# Patient Record
Sex: Male | Born: 1990 | Hispanic: Yes | Marital: Married | Smoking: Never smoker
Health system: Southern US, Community
[De-identification: ages and names within clinical notes are randomized; demographics above are authoritative.]

## PROBLEM LIST (undated history)

## (undated) DIAGNOSIS — E739 Lactose intolerance, unspecified: Secondary | ICD-10-CM

## (undated) DIAGNOSIS — E7800 Pure hypercholesterolemia, unspecified: Secondary | ICD-10-CM

## (undated) DIAGNOSIS — I10 Essential (primary) hypertension: Secondary | ICD-10-CM

## (undated) DIAGNOSIS — R739 Hyperglycemia, unspecified: Secondary | ICD-10-CM

## (undated) DIAGNOSIS — R7303 Prediabetes: Secondary | ICD-10-CM

## (undated) HISTORY — DX: Prediabetes: R73.03

## (undated) HISTORY — DX: Lactose intolerance, unspecified: E73.9

## (undated) HISTORY — DX: Hyperglycemia, unspecified: R73.9

## (undated) HISTORY — DX: Pure hypercholesterolemia, unspecified: E78.00

## (undated) HISTORY — DX: Essential (primary) hypertension: I10

---
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
## 2017-05-08 ENCOUNTER — Other Ambulatory Visit: Payer: Self-pay

## 2017-05-08 ENCOUNTER — Encounter: Payer: Self-pay | Admitting: Emergency Medicine

## 2017-05-08 ENCOUNTER — Emergency Department
Admission: EM | Admit: 2017-05-08 | Discharge: 2017-05-08 | Disposition: A | Discharge status: Home / Self Care | Payer: Self-pay | Attending: Emergency Medicine | Admitting: Emergency Medicine

## 2017-05-08 DIAGNOSIS — J020 Streptococcal pharyngitis: Secondary | ICD-10-CM | POA: Insufficient documentation

## 2017-05-08 MED ORDER — AMOXICILLIN 875 MG PO TABS: 875.0000 mg | ORAL_TABLET | Freq: Two times a day (BID) | ORAL | 0 refills | Status: AC

## 2017-05-08 NOTE — ED Notes (Signed)
See triage note  Presents with sore throat  States he began feeling bad about 2 weeks ago  Then felt better   But on Sunday he developed fever and sore throat  Low grade fever noted on arrival

## 2017-05-08 NOTE — ED Triage Notes (Signed)
Pt with sore throat for two days

## 2017-05-08 NOTE — ED Triage Notes (Signed)
First Nurse Note:  Arrives with c/o sore throat.  AAOx3.  Skin warm and dry.. NAD.  Voice clear and strong.

## 2017-05-08 NOTE — ED Provider Notes (Signed)
[facility] Emergency Department Provider Note  ____________________________________________  Time seen: Approximately 4:14 PM  I have reviewed the triage vital signs and the nursing notes.   HISTORY  Chief Complaint Sore Throat    HPI Stanley Rice is a 27 y.o. male presents to the emergency department with pharyngitis.  Patient has experienced intermittent rhinorrhea and nonproductive cough but headache and anterior neck discomfort are primary symptoms.  Patient is tolerating fluids by mouth and his own secretions.  No known contacts with strep throat.  He denies chest tightness, chest pain, nausea and abdominal pain.   History reviewed. No pertinent past medical history.  There are no active problems to display for this patient.   History reviewed. No pertinent surgical history.  Prior to Admission medications   Medication Sig Start Date End Date Taking? Authorizing Provider  amoxicillin (AMOXIL) 875 MG tablet Take 1 tablet (875 mg total) by mouth 2 (two) times daily for 10 days. 05/08/17 05/18/17  Orvil Feil, PA-C    Allergies Patient has no known allergies.  No family history on file.  Social History Social History   Tobacco Use  . Smoking status: Never Smoker  . Smokeless tobacco: Never Used  Substance Use Topics  . Alcohol use: No    Frequency: Never  . Drug use: No     Review of Systems  Constitutional: Patient has chills.  Eyes: No visual changes. No discharge ENT: patient has pharyngitis.  Cardiovascular: no chest pain. Respiratory: Intermittent cough. No SOB. Gastrointestinal: No abdominal pain.  No nausea, no vomiting.  No diarrhea.  No constipation. Musculoskeletal: Negative for musculoskeletal pain. Skin: Negative for rash, abrasions, lacerations, ecchymosis. Neurological: Patient has headache.    ____________________________________________   PHYSICAL EXAM:  VITAL SIGNS: ED Triage Vitals  Enc Vitals Group     BP  05/08/17 1539 (!) 160/108     Pulse Rate 05/08/17 1539 97     Resp 05/08/17 1539 20     Temp 05/08/17 1539 99 F (37.2 C)     Temp Source 05/08/17 1539 Oral     SpO2 05/08/17 1539 100 %     Weight 05/08/17 1538 210 lb (95.3 kg)     Height --      Head Circumference --      Peak Flow --      Pain Score 05/08/17 1538 6     Pain Loc --      Pain Edu? --      Excl. in GC? --      Constitutional: Alert and oriented. Well appearing and in no acute distress. Eyes: Conjunctivae are normal. PERRL. EOMI. Head: Atraumatic. ENT:      Ears: TMs are pearly bilaterally.      Nose: No congestion/rhinnorhea.      Mouth/Throat: Mucous membranes are moist.  Posterior pharynx is erythematous with bilateral tonsillar hypertrophy and exudate. Hematological/Lymphatic/Immunilogical: Palpable cervical lymphadenopathy. Cardiovascular: Normal rate, regular rhythm. Normal S1 and S2.  Good peripheral circulation. Respiratory: Normal respiratory effort without tachypnea or retractions. Lungs CTAB. Good air entry to the bases with no decreased or absent breath sounds. Skin:  Skin is warm, dry and intact. No rash noted. Psychiatric: Mood and affect are normal. Speech and behavior are normal. Patient exhibits appropriate insight and judgement.   ____________________________________________   LABS (all labs ordered are listed, but only abnormal results are displayed)  Labs Reviewed - No data to display ____________________________________________  EKG   ____________________________________________  RADIOLOGY  No results found.  ____________________________________________    PROCEDURES  Procedure(s) performed:    Procedures    Medications - No data to display   ____________________________________________   INITIAL IMPRESSION / ASSESSMENT AND PLAN / ED COURSE  Pertinent labs & imaging results that were available during my care of the patient were reviewed by me and considered in  my medical decision making (see chart for details).  Review of the [place] CSRS was performed in accordance of the [facility] prior to dispensing any controlled drugs.     Assessment and plan Strep pharyngitis Patient presents to the emergency department with pharyngitis, chills, anterior neck discomfort and malaise.  Differential diagnosis included URI versus strep pharyngitis.  History and physical exam findings are most consistent with strep pharyngitis.  Patient was discharged with amoxicillin and advised to follow-up with primary care as needed.  All patient questions were answered.    ____________________________________________  FINAL CLINICAL IMPRESSION(S) / ED DIAGNOSES  Final diagnoses:  Strep pharyngitis      NEW MEDICATIONS STARTED DURING THIS VISIT:  ED Discharge Orders        Ordered    amoxicillin (AMOXIL) 875 MG tablet  2 times daily     05/08/17 1600          This chart was dictated using voice recognition software/Dragon. Despite best efforts to proofread, errors can occur which can change the meaning. Any change was purely unintentional.    Orvil FeilWoods, Yaire Kreher M, PA-C 05/08/17 1618    Dionne BucySiadecki, Sebastian, MD 05/08/17 2024

## 2017-12-21 ENCOUNTER — Other Ambulatory Visit: Payer: Self-pay

## 2017-12-21 ENCOUNTER — Emergency Department
Admission: EM | Admit: 2017-12-21 | Discharge: 2017-12-21 | Disposition: A | Discharge status: Home / Self Care | Payer: Self-pay | Attending: Emergency Medicine | Admitting: Emergency Medicine

## 2017-12-21 ENCOUNTER — Emergency Department: Payer: Self-pay

## 2017-12-21 ENCOUNTER — Encounter: Payer: Self-pay | Admitting: Emergency Medicine

## 2017-12-21 DIAGNOSIS — J020 Streptococcal pharyngitis: Secondary | ICD-10-CM | POA: Insufficient documentation

## 2017-12-21 LAB — COMPREHENSIVE METABOLIC PANEL
ALT: 28 U/L (ref 0–44)
AST: 18 U/L (ref 15–41)
Albumin: 4.4 g/dL (ref 3.5–5.0)
Alkaline Phosphatase: 59 U/L (ref 38–126)
Anion gap: 8 (ref 5–15)
BUN: 11 mg/dL (ref 6–20)
CO2: 25 mmol/L (ref 22–32)
Calcium: 8.9 mg/dL (ref 8.9–10.3)
Chloride: 102 mmol/L (ref 98–111)
Creatinine, Ser: 0.79 mg/dL (ref 0.61–1.24)
GFR calc Af Amer: >60 mL/min (ref >60)
GFR calc non Af Amer: >60 mL/min (ref >60)
Glucose, Bld: 99 mg/dL (ref 70–99)
Potassium: 3.7 mmol/L (ref 3.5–5.1)
Sodium: 135 mmol/L (ref 135–145)
Total Bilirubin: 0.4 mg/dL (ref 0.3–1.2)
Total Protein: 8 g/dL (ref 6.5–8.1)

## 2017-12-21 LAB — CBC WITH DIFFERENTIAL/PLATELET
Basophils Absolute: 0.1 10*3/uL (ref 0–0.1)
Basophils Relative: 1 %
Eosinophils Absolute: 0 10*3/uL (ref 0–0.7)
Eosinophils Relative: 0 %
HCT: 41.4 % (ref 40.0–52.0)
Hemoglobin: 14 g/dL (ref 13.0–18.0)
Lymphocytes Relative: 22 %
Lymphs Abs: 2.7 10*3/uL (ref 1.0–3.6)
MCH: 28.9 pg (ref 26.0–34.0)
MCHC: 33.8 g/dL (ref 32.0–36.0)
MCV: 85.5 fL (ref 80.0–100.0)
Monocytes Absolute: 1.5 10*3/uL — ABNORMAL HIGH (ref 0.2–1.0)
Monocytes Relative: 12 %
Neutro Abs: 7.8 10*3/uL — ABNORMAL HIGH (ref 1.4–6.5)
Neutrophils Relative %: 65 %
Platelets: 207 10*3/uL (ref 150–440)
RBC: 4.84 MIL/uL (ref 4.40–5.90)
RDW: 14.3 % (ref 11.5–14.5)
WBC: 12 10*3/uL — ABNORMAL HIGH (ref 3.8–10.6)

## 2017-12-21 MED ORDER — DEXAMETHASONE SODIUM PHOSPHATE 10 MG/ML IJ SOLN
10.0000 mg | Freq: Once | INTRAMUSCULAR | Status: DC
  Filled 2017-12-21: qty 1

## 2017-12-21 MED ORDER — SODIUM CHLORIDE 0.9 % IV SOLN
3.0000 g | Freq: Once | INTRAVENOUS | Status: AC
  Administered 2017-12-21: 3 g via INTRAVENOUS
  Filled 2017-12-21: qty 3

## 2017-12-21 MED ORDER — IOHEXOL 300 MG/ML  SOLN
75.0000 mL | Freq: Once | INTRAMUSCULAR | Status: AC | PRN
  Administered 2017-12-21: 75 mL via INTRAVENOUS
  Filled 2017-12-21: qty 75

## 2017-12-21 MED ORDER — ACETAMINOPHEN 325 MG PO TABS
650.0000 mg | ORAL_TABLET | Freq: Once | ORAL | Status: AC | PRN
  Administered 2017-12-21: 650 mg via ORAL
  Filled 2017-12-21: qty 2

## 2017-12-21 MED ORDER — AMOXICILLIN-POT CLAVULANATE 875-125 MG PO TABS: 1.0000 | ORAL_TABLET | Freq: Two times a day (BID) | ORAL | 0 refills | Status: AC

## 2017-12-21 MED ORDER — DEXAMETHASONE SODIUM PHOSPHATE 10 MG/ML IJ SOLN
10.0000 mg | Freq: Once | INTRAMUSCULAR | Status: AC
  Administered 2017-12-21: 10 mg via INTRAVENOUS

## 2017-12-21 NOTE — ED Provider Notes (Signed)
[facility] Emergency Department Provider Note  ____________________________________________  Time seen: Approximately 9:17 PM  I have reviewed the triage vital signs and the nursing notes.   HISTORY  Chief Complaint Sore Throat    HPI Stanley Rice is a 27 y.o. male presents to the emergency department with 10 out of 10 pharyngitis.  Patient reports that 2 days ago, he was seen at an urgent care facility and was diagnosed with strep pharyngitis and began taking penicillin.  Patient reports that pharyngitis seems to be worsening and his fever has not improved.  Patient reports that the right side of his throat hurts worse than the left.  Patient can swallow but is having difficulty drinking.  He denies a history of known peritonsillar abscesses.    History reviewed. No pertinent past medical history.  There are no active problems to display for this patient.   History reviewed. No pertinent surgical history.  Prior to Admission medications   Medication Sig Start Date End Date Taking? Authorizing Provider  amoxicillin-clavulanate (AUGMENTIN) 875-125 MG tablet Take 1 tablet by mouth 2 (two) times daily for 10 days. 12/21/17 12/31/17  Orvil FeilWoods, Jaclyn M, PA-C    Allergies Patient has no known allergies.  No family history on file.  Social History Social History   Tobacco Use  . Smoking status: Never Smoker  . Smokeless tobacco: Never Used  Substance Use Topics  . Alcohol use: No    Frequency: Never  . Drug use: No     Review of Systems  Constitutional: Patient has fever.  Eyes: No visual changes. No discharge ENT: Patient has pharyngitis.  Cardiovascular: no chest pain. Respiratory: no cough. No SOB. Gastrointestinal: No abdominal pain.  No nausea, no vomiting.  No diarrhea.  No constipation. Genitourinary: Negative for dysuria. No hematuria Musculoskeletal: Negative for musculoskeletal pain. Skin: Negative for rash, abrasions, lacerations,  ecchymosis. Neurological: Negative for headaches, focal weakness or numbness.   ____________________________________________   PHYSICAL EXAM:  VITAL SIGNS: ED Triage Vitals  Enc Vitals Group     BP 12/21/17 2015 139/78     Pulse Rate 12/21/17 2103 88     Resp 12/21/17 2015 20     Temp 12/21/17 2015 (!) 101.1 F (38.4 C)     Temp Source 12/21/17 2015 Oral     SpO2 12/21/17 2015 98 %     Weight 12/21/17 2016 215 lb (97.5 kg)     Height 12/21/17 2016 5\' 8"  (1.727 m)     Head Circumference --      Peak Flow --      Pain Score 12/21/17 2016 5     Pain Loc --      Pain Edu? --      Excl. in GC? --      Constitutional: Alert and oriented. Well appearing and in no acute distress. Eyes: Conjunctivae are normal. PERRL. EOMI. Head: Atraumatic. ENT:      Ears: TMs are pearly.      Nose: No congestion/rhinnorhea.      Mouth/Throat: Posterior pharynx is erythematous.  Right tonsil appears significantly larger than left. Neck: No stridor.  No cervical spine tenderness to palpation. Hematological/Lymphatic/Immunilogical: Palpable  cervical lymphadenopathy. Cardiovascular: Normal rate, regular rhythm. Normal S1 and S2.  Good peripheral circulation. Respiratory: Normal respiratory effort without tachypnea or retractions. Lungs CTAB. Good air entry to the bases with no decreased or absent breath sounds. Musculoskeletal: Full range of motion to all extremities. No gross deformities appreciated. Neurologic:  Normal  speech and language. No gross focal neurologic deficits are appreciated.  Skin:  Skin is warm, dry and intact. No rash noted. ____________________________________________   LABS (all labs ordered are listed, but only abnormal results are displayed)  Labs Reviewed  CBC WITH DIFFERENTIAL/PLATELET - Abnormal; Notable for the following components:      Result Value   WBC 12.0 (*)    Neutro Abs 7.8 (*)    Monocytes Absolute 1.5 (*)    All other components within normal limits   COMPREHENSIVE METABOLIC PANEL   ____________________________________________  EKG   ____________________________________________  RADIOLOGY I personally viewed and evaluated these images as part of my medical decision making, as well as reviewing the written report by the radiologist.    Ct Soft Tissue Neck W Contrast  Result Date: 12/21/2017 CLINICAL DATA:  Sore throat for 3 days. EXAM: CT NECK WITH CONTRAST TECHNIQUE: Multidetector CT imaging of the neck was performed using the standard protocol following the bolus administration of intravenous contrast. CONTRAST:  75mL OMNIPAQUE IOHEXOL 300 MG/ML  SOLN COMPARISON:  None. FINDINGS: PHARYNX AND LARYNX: Mildly enlarged and edematous palatine tonsils. Preservation of the parapharyngeal fat planes. Normal appearance of the epiglottis and larynx. Widely patent airway. SALIVARY GLANDS: Normal. THYROID: Normal. LYMPH NODES: Multiple enlarged cervical lymph nodes measuring to 16 mm RIGHT level 2 a with homogeneous enhancement and no pericapsular inflammation. VASCULAR: Normal.  Two vessel arch is a normal variant. LIMITED INTRACRANIAL: Normal. VISUALIZED ORBITS: Normal. MASTOIDS AND VISUALIZED PARANASAL SINUSES: Well-aerated. SKELETON: Nonacute.  Calcified stylohyoid ligaments UPPER CHEST: Lung apices are clear. No superior mediastinal lymphadenopathy. OTHER: None. IMPRESSION: 1. Mild acute tonsillitis without peritonsillar abscess. Patent airway. 2. Reactive lymphadenitis. Electronically Signed   By: Awilda Metro M.D.   On: 12/21/2017 22:21    ____________________________________________    PROCEDURES  Procedure(s) performed:    Procedures    Medications  acetaminophen (TYLENOL) tablet 650 mg (650 mg Oral Given 12/21/17 2019)  Ampicillin-Sulbactam (UNASYN) 3 g in sodium chloride 0.9 % 100 mL IVPB ( Intravenous Stopped 12/21/17 2154)  dexamethasone (DECADRON) injection 10 mg (10 mg Intravenous Given 12/21/17 2115)  iohexol  (OMNIPAQUE) 300 MG/ML solution 75 mL (75 mLs Intravenous Contrast Given 12/21/17 2205)     ____________________________________________   INITIAL IMPRESSION / ASSESSMENT AND PLAN / ED COURSE  Pertinent labs & imaging results that were available during my care of the patient were reviewed by me and considered in my medical decision making (see chart for details).  Review of the [place] CSRS was performed in accordance of the [facility] prior to dispensing any controlled drugs.  Assessment and Plan:  Strep pharyngitis Differential diagnosis included peritonsillar abscess versus tonsillitis.  On physical exam, patient's right tonsil appears significantly larger than left.  CT soft tissue neck revealed no evidence of peritonsillar abscess.  Patient was given Unasyn and Decadron in the emergency department.  Patient was advised to stop taking penicillin and was transitioned to Augmentin.  Strict return precautions were given to return to the emergency department for new or worsening symptoms.  All patient questions were answered.    ____________________________________________  FINAL CLINICAL IMPRESSION(S) / ED DIAGNOSES  Final diagnoses:  Strep throat      NEW MEDICATIONS STARTED DURING THIS VISIT:  ED Discharge Orders         Ordered    amoxicillin-clavulanate (AUGMENTIN) 875-125 MG tablet  2 times daily     12/21/17 2230  This chart was dictated using voice recognition software/Dragon. Despite best efforts to proofread, errors can occur which can change the meaning. Any change was purely unintentional.    Orvil Feil, PA-C 12/21/17 2333    Sharyn Creamer, MD 12/21/17 [phone]

## 2017-12-21 NOTE — ED Triage Notes (Signed)
Pt reports diagnosed with Strep throat on Wednesday taking antibiotics Penicillin, reports not feeling better and now has sore in lips. Pt talks in complete sentences no respiratory distress noted

## 2017-12-21 NOTE — ED Notes (Signed)
Pt reports sore throat x3 days. Pt seen by PCP on Wednesday and swabbed (negative for Strep). Pt given penicillin r/x and taken since Wednesday night without relief.

## 2017-12-21 NOTE — ED Notes (Signed)
White patch noted on right tonsil. Pt states pain started on tues and has been having fever and chills.

## 2018-10-24 ENCOUNTER — Other Ambulatory Visit: Payer: Self-pay

## 2018-10-24 DIAGNOSIS — Z20822 Contact with and (suspected) exposure to covid-19: Secondary | ICD-10-CM

## 2018-10-29 LAB — NOVEL CORONAVIRUS, NAA: SARS-CoV-2, NAA: NOT DETECTED

## 2019-03-27 IMAGING — CT CT NECK W/ CM
4 of 5 series · 15 of 33 positions shown, 17 images · IV contrast (omnipaque)
Comparison: None.

CLINICAL DATA: Sore throat for 3 days.

EXAM:
CT NECK WITH CONTRAST
TECHNIQUE: Multidetector CT imaging of the neck was performed using the
standard protocol following the bolus administration of intravenous
contrast.
CONTRAST:  75mL OMNIPAQUE IOHEXOL 300 MG/ML  SOLN

[Series 2: axial neck · axial · 0.54mm/px · z∈[+295,+401]mm · 3 of 107 slices shown]
[im 27/107  bone]
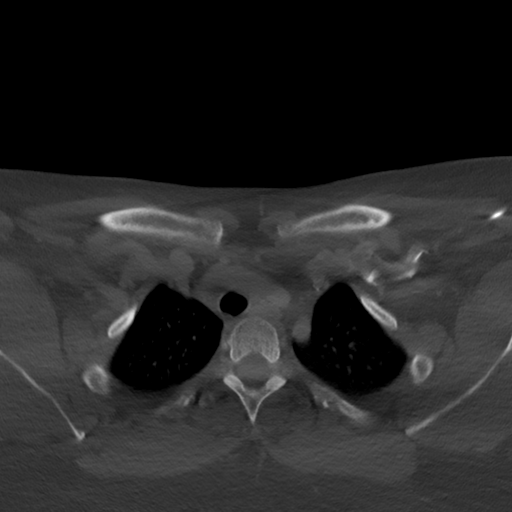
[im 54/107  bone]
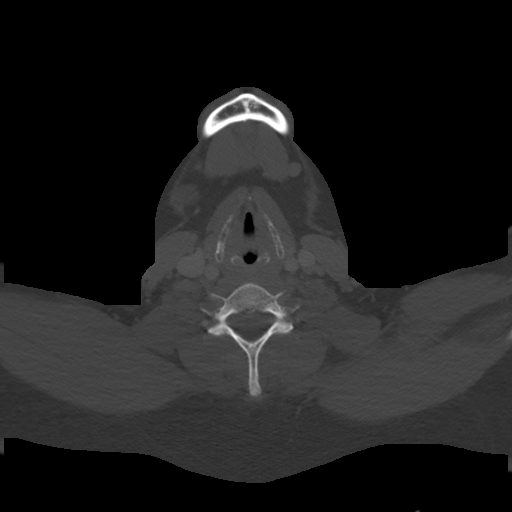
[im 80/107  bone]
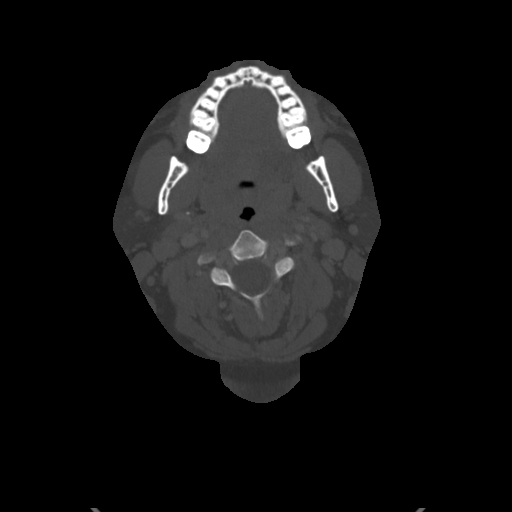

[Series 6: sag neck · sagittal · 0.45mm/px · 5 of 83 slices shown, 6 images]
[im 28/83  bone]
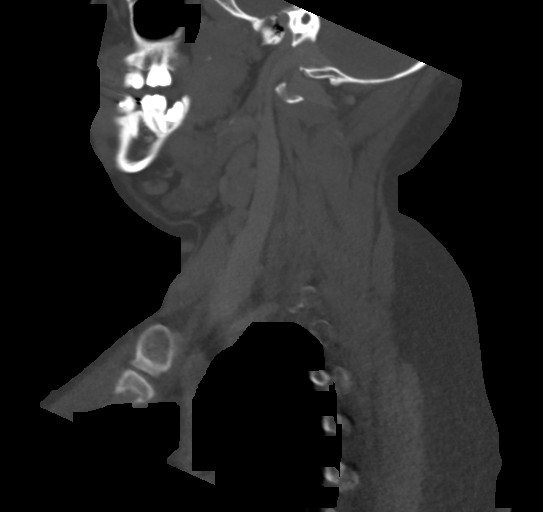
[im 35/83  bone]
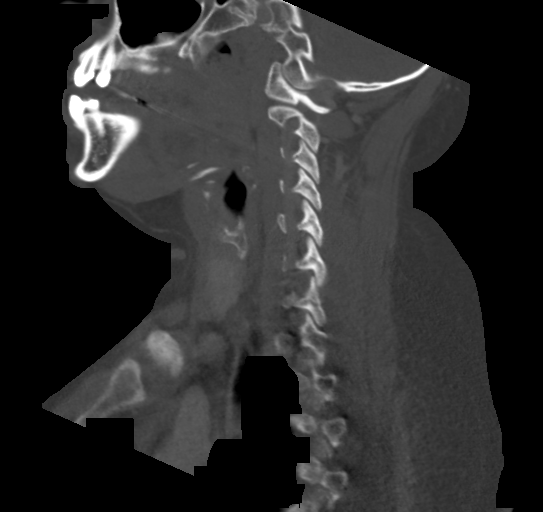
[im 42/83  soft-tissue]
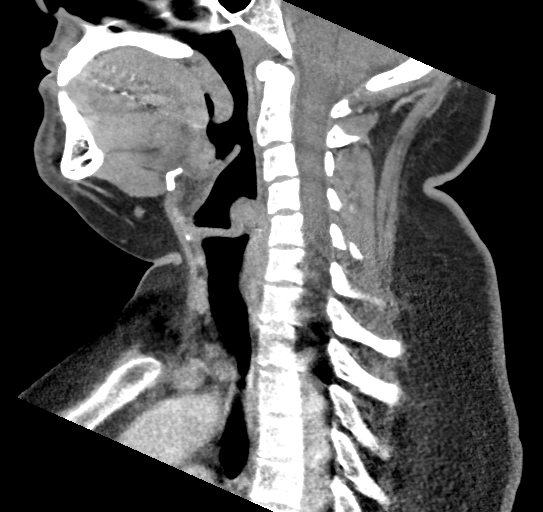
[im 42/83  bone]
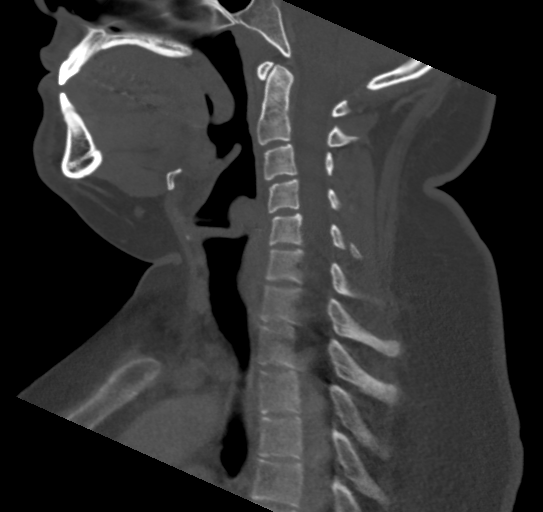
[im 48/83  bone]
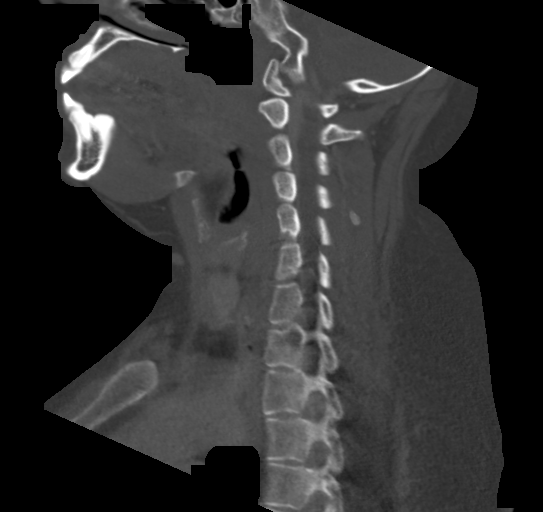
[im 55/83  bone]
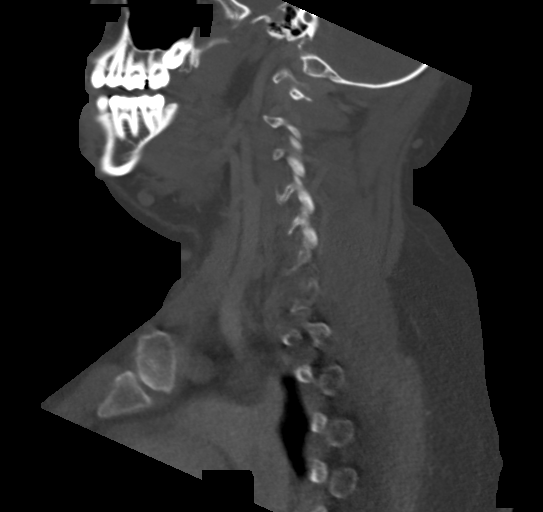

[Series 7: cor neck · coronal · 0.39mm/px · 3 of 91 slices shown]
[im 22/91  bone]
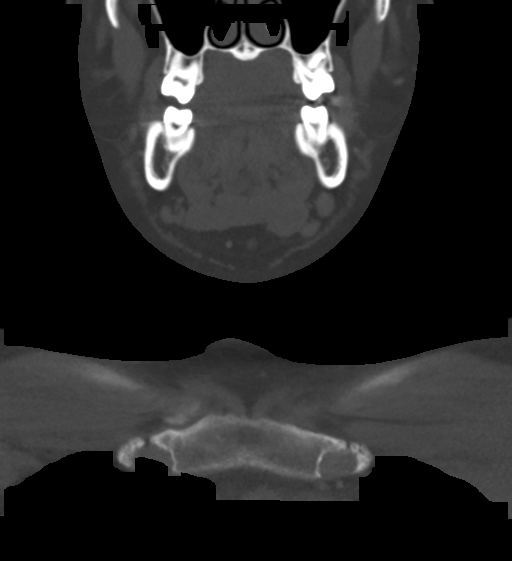
[im 38/91  bone]
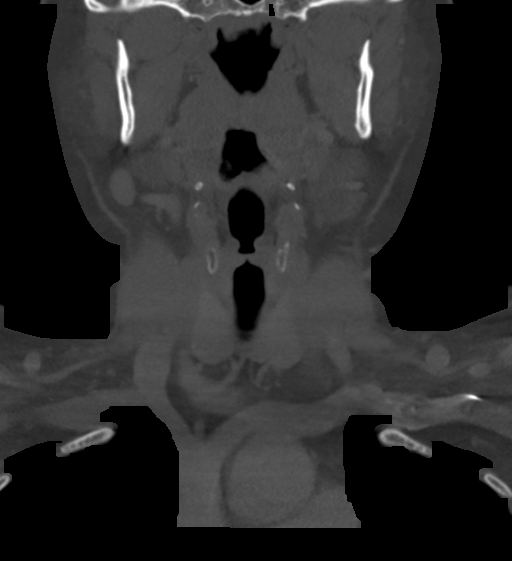
[im 53/91  bone]
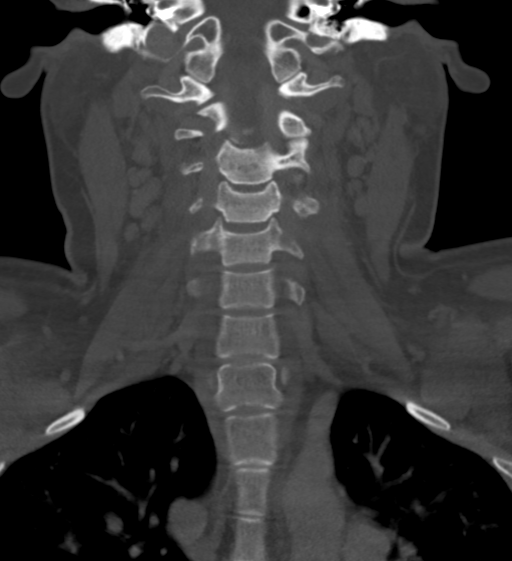

[Series 8: orthogonal ax · axial · 0.40mm/px · z∈[+246,+359]mm · 4 of 106 slices shown, 5 images]
[im 22/106  soft-tissue]
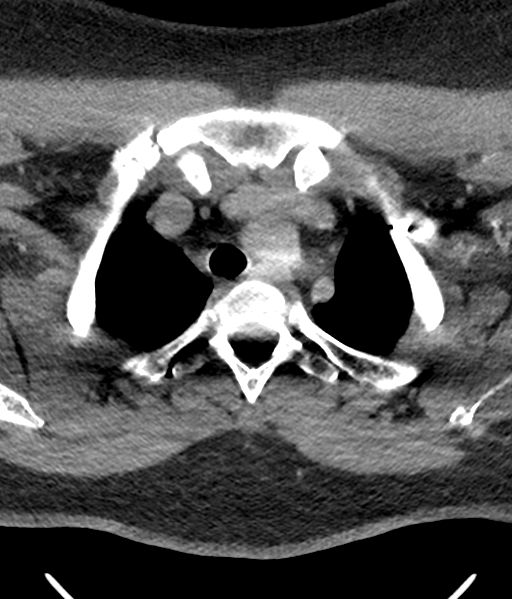
[im 22/106  bone]
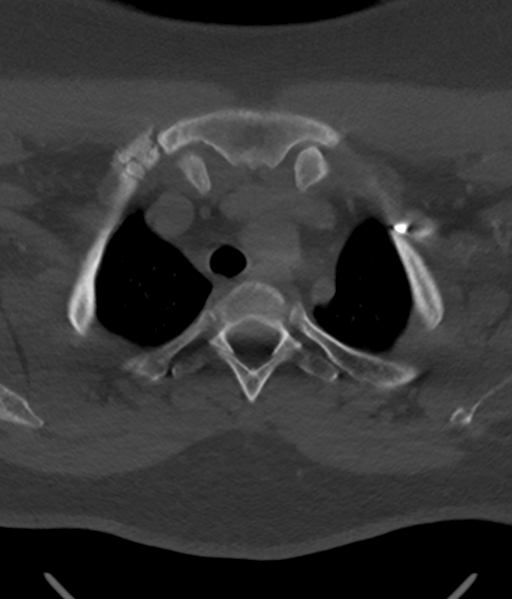
[im 43/106  bone]
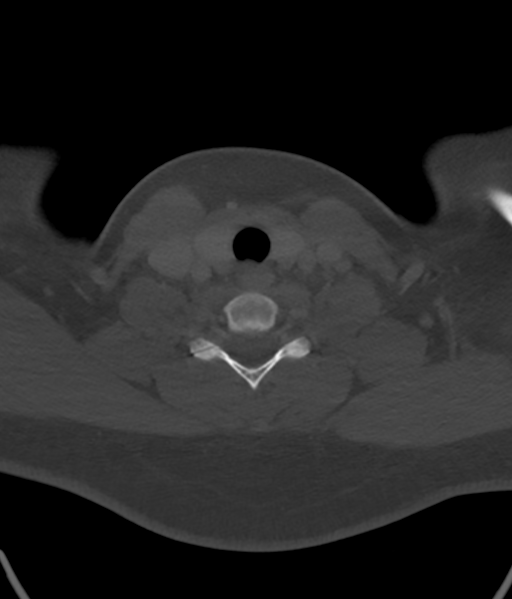
[im 64/106  bone]
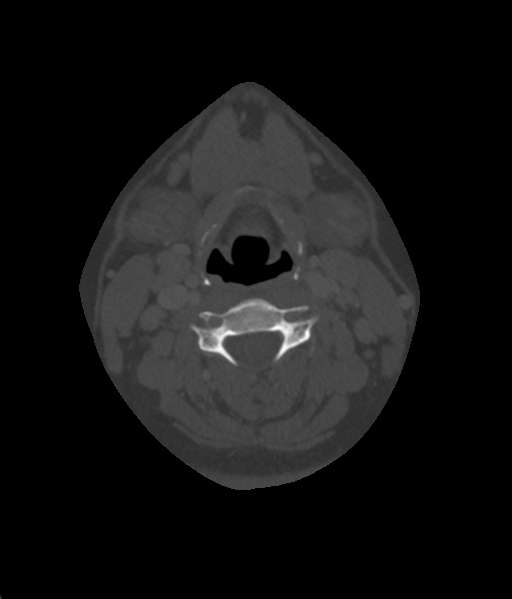
[im 85/106  bone]
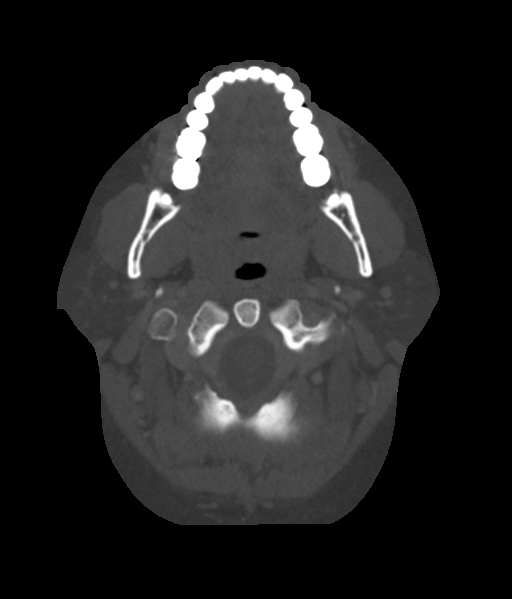

[15 of 33 positions shown; findings below may reference images not displayed]

FINDINGS: PHARYNX AND LARYNX: Mildly enlarged and edematous palatine tonsils.
Preservation of the parapharyngeal fat planes. Normal appearance of
the epiglottis and larynx. Widely patent airway.

SALIVARY GLANDS: Normal.

THYROID: Normal.

LYMPH NODES: Multiple enlarged cervical lymph nodes measuring to 16
mm RIGHT level 2 a with homogeneous enhancement and no pericapsular
inflammation.

VASCULAR: Normal.  Two vessel arch is a normal variant.

LIMITED INTRACRANIAL: Normal.

VISUALIZED ORBITS: Normal.

MASTOIDS AND VISUALIZED PARANASAL SINUSES: Well-aerated.

SKELETON: Nonacute.  Calcified stylohyoid ligaments

UPPER CHEST: Lung apices are clear. No superior mediastinal
lymphadenopathy.

OTHER: None.
IMPRESSION: 1. Mild acute tonsillitis without peritonsillar abscess. Patent
airway.
2. Reactive lymphadenitis.

## 2019-07-14 ENCOUNTER — Ambulatory Visit: Payer: Self-pay | Attending: Internal Medicine

## 2019-07-14 DIAGNOSIS — Z23 Encounter for immunization: Secondary | ICD-10-CM

## 2019-07-14 NOTE — Progress Notes (Signed)
   Covid-19 Vaccination Clinic  Name:  Stanley Rice    MRN: [id number] DOB: 02-22-1991  07/14/2019  Mr. Stanley Rice was observed post Covid-19 immunization for 15 minutes without incident. He was provided with Vaccine Information Sheet and instruction to access the V-Safe system.   Mr. Stanley Rice was instructed to call 911 with any severe reactions post vaccine: Marland Kitchen Difficulty breathing  . Swelling of face and throat  . A fast heartbeat  . A bad rash all over body  . Dizziness and weakness   Immunizations Administered    Name Date Dose VIS Date Route   Pfizer COVID-19 Vaccine 07/14/2019  8:27 AM 0.3 mL 03/21/2019 Intramuscular   Manufacturer: [employer], [employer]   Lot: [phone] [id number]   NDC: [address]-7

## 2019-08-05 ENCOUNTER — Ambulatory Visit: Payer: Self-pay

## 2019-08-12 ENCOUNTER — Ambulatory Visit: Payer: Self-pay | Attending: Internal Medicine

## 2019-08-12 DIAGNOSIS — Z23 Encounter for immunization: Secondary | ICD-10-CM

## 2019-08-12 NOTE — Progress Notes (Signed)
   Covid-19 Vaccination Clinic  Name:  Valon Glasscock    MRN: [id number] DOB: 12-07-1990  08/12/2019  Mr. Schmieder was observed post Covid-19 immunization for 15 minutes without incident. He was provided with Vaccine Information Sheet and instruction to access the V-Safe system.   Mr. Hartline was instructed to call 911 with any severe reactions post vaccine: Marland Kitchen Difficulty breathing  . Swelling of face and throat  . A fast heartbeat  . A bad rash all over body  . Dizziness and weakness   Immunizations Administered    Name Date Dose VIS Date Route   Pfizer COVID-19 Vaccine 08/12/2019  1:44 PM 0.3 mL 06/04/2018 Intramuscular   Manufacturer: [employer], [employer]   Lot: [id number]   NDC: [address]-0

## 2019-08-23 ENCOUNTER — Telehealth: Payer: Self-pay

## 2019-12-30 ENCOUNTER — Other Ambulatory Visit: Payer: Self-pay

## 2020-01-01 ENCOUNTER — Encounter: Payer: Self-pay | Admitting: Nurse Practitioner

## 2020-01-01 ENCOUNTER — Ambulatory Visit (INDEPENDENT_AMBULATORY_CARE_PROVIDER_SITE_OTHER): Payer: Managed Care, Other (non HMO) | Admitting: Nurse Practitioner

## 2020-01-01 ENCOUNTER — Other Ambulatory Visit: Payer: Self-pay

## 2020-01-01 VITALS — BP 140/92 | HR 85 | Temp 98.2°F | Ht 68.0 in | Wt 215.0 lb

## 2020-01-01 DIAGNOSIS — R7303 Prediabetes: Secondary | ICD-10-CM

## 2020-01-01 DIAGNOSIS — Z0000 Encounter for general adult medical examination without abnormal findings: Secondary | ICD-10-CM

## 2020-01-01 DIAGNOSIS — E782 Mixed hyperlipidemia: Secondary | ICD-10-CM

## 2020-01-01 DIAGNOSIS — Z6832 Body mass index (BMI) 32.0-32.9, adult: Secondary | ICD-10-CM

## 2020-01-01 DIAGNOSIS — E6609 Other obesity due to excess calories: Secondary | ICD-10-CM | POA: Insufficient documentation

## 2020-01-01 DIAGNOSIS — I10 Essential (primary) hypertension: Secondary | ICD-10-CM | POA: Diagnosis not present

## 2020-01-01 DIAGNOSIS — E66811 Obesity, class 1: Secondary | ICD-10-CM

## 2020-01-01 NOTE — Patient Instructions (Addendum)
Please continue to work on healthy diet and exercise-30 minutes 3-5 times a week.  Walking is a good source, or any kind of physical activity that you enjoy doing.  Healthy diet is one low in sugar, simple carbohydrates, fats, fast food, restaurant meals.  Focus on lean protein, nonfat or low-fat dairy products only, lots of vegetables, fruits, canola or olive oil- small amounts.   Purchase a blood pressure arm cuff-check random blood pressures write this down and call us.  Blood pressure remains above 130/80 or above, you do need to start medication while you are working on lifestyle change.    A normal blood pressure is less than 120/80.  That is where you ideally should be at age 29 years old.  Your cholesterol panel was also elevated, your BMI is 32 with goal 25, and you have prediabetes with 35-month average blood sugar 5.7.  Elevated blood pressure with new diagnosis of hypertension.  All of this is treated with diet,  Exercise,  and medication as needed.  Your BP was 150/110 repeated. I recommend starting you on a low dose BP medicine-like  losartan 25 mg daily. You want to change your lifestyle and  check your BP at home and bring in readings to discuss in a month.    I have placed referral into the medical weight loss group and they will help you get started on a plan that will work for you.  In the meantime, here is literature below to review.   Preventing Type 2 Diabetes Mellitus Type 2 diabetes (type 2 diabetes mellitus) is a long-term (chronic) disease that affects blood sugar (glucose) levels. Normally, a hormone called insulin allows glucose to enter cells in the body. The cells use glucose for energy. In type 2 diabetes, one or both of these problems may be present:  The body does not make enough insulin.  The body does not respond properly to insulin that it makes (insulin resistance). Insulin resistance or lack of insulin causes excess glucose to build up in the blood instead of  going into cells. As a result, high blood glucose (hyperglycemia) develops, which can cause many complications. Being overweight or obese and having an inactive (sedentary) lifestyle can increase your risk for diabetes. Type 2 diabetes can be delayed or prevented by making certain nutrition and lifestyle changes. What nutrition changes can be made?   Eat healthy meals and snacks regularly. Keep a healthy snack with you for when you get hungry between meals, such as fruit or a handful of nuts.  Eat lean meats and proteins that are low in saturated fats, such as chicken, fish, egg whites, and beans. Avoid processed meats.  Eat plenty of fruits and vegetables and plenty of grains that have not been processed (whole grains). It is recommended that you eat: ? 1?2 cups of fruit every day. ? 2?3 cups of vegetables every day. ? 6?8 oz of whole grains every day, such as oats, whole wheat, bulgur, brown rice, quinoa, and millet.  Eat low-fat dairy products, such as milk, yogurt, and cheese.  Eat foods that contain healthy fats, such as nuts, avocado, olive oil, and canola oil.  Drink water throughout the day. Avoid drinks that contain added sugar, such as soda or sweet tea.  Follow instructions from your health care provider about specific eating or drinking restrictions.  Control how much food you eat at a time (portion size). ? Check food labels to find out the serving sizes of foods. ?  Use a kitchen scale to weigh amounts of foods.  Saute or steam food instead of frying it. Cook with water or broth instead of oils or butter.  Limit your intake of: ? Salt (sodium). Have no more than 1 tsp (2,400 mg) of sodium a day. If you have heart disease or high blood pressure, have less than ? tsp (1,500 mg) of sodium a day. ? Saturated fat. This is fat that is solid at room temperature, such as butter or fat on meat. What lifestyle changes can be made? Activity   Do moderate-intensity physical  activity for at least 30 minutes on at least 5 days of the week, or as much as told by your health care provider.  Ask your health care provider what activities are safe for you. A mix of physical activities may be best, such as walking, swimming, cycling, and strength training.  Try to add physical activity into your day. For example: ? Park in spots that are farther away than usual, so that you walk more. For example, park in a far corner of the parking lot when you go to the office or the grocery store. ? Take a walk during your lunch break. ? Use stairs instead of elevators or escalators. Weight Loss  Lose weight as directed. Your health care provider can determine how much weight loss is best for you and can help you lose weight safely.  If you are overweight or obese, you may be instructed to lose at least 5?7 % of your body weight. Alcohol and Tobacco   Limit alcohol intake to no more than 1 drink a day for nonpregnant women and 2 drinks a day for men. One drink equals 12 oz of beer, 5 oz of wine, or 1 oz of hard liquor.  Do not use any tobacco products, such as cigarettes, chewing tobacco, and e-cigarettes. If you need help quitting, ask your health care provider. Work With Your Health Care Provider  Have your blood glucose tested regularly, as told by your health care provider.  Discuss your risk factors and how you can reduce your risk for diabetes.  Get screening tests as told by your health care provider. You may have screening tests regularly, especially if you have certain risk factors for type 2 diabetes.  Make an appointment with a diet and nutrition specialist (registered dietitian). A registered dietitian can help you make a healthy eating plan and can help you understand portion sizes and food labels. Why are these changes important?  It is possible to prevent or delay type 2 diabetes and related health problems by making lifestyle and nutrition changes.  It can be  difficult to recognize signs of type 2 diabetes. The best way to avoid possible damage to your body is to take actions to prevent the disease before you develop symptoms. What can happen if changes are not made?  Your blood glucose levels may keep increasing. Having high blood glucose for a long time is dangerous. Too much glucose in your blood can damage your blood vessels, heart, kidneys, nerves, and eyes.  You may develop prediabetes or type 2 diabetes. Type 2 diabetes can lead to many chronic health problems and complications, such as: ? Heart disease. ? Stroke. ? Blindness. ? Kidney disease. ? Depression. ? Poor circulation in the feet and legs, which could lead to surgical removal (amputation) in severe cases. Where to find support  Ask your health care provider to recommend a registered dietitian, diabetes educator, or  weight loss program.  Look for local or online weight loss groups.  Join a gym, fitness club, or outdoor activity group, such as a walking club. Where to find more information To learn more about diabetes and diabetes prevention, visit:  American Diabetes Association (ADA): www.diabetes.[employer] of Diabetes and Digestive and Kidney Diseases: ToyArticles.ca To learn more about healthy eating, visit:  The U.[place] Department of Agriculture Architect), Choose My Plate: http://yates.biz/  Office of Disease Prevention and Health Promotion (ODPHP), Dietary Guidelines: ListingMagazine.si Summary  You can reduce your risk for type 2 diabetes by increasing your physical activity, eating healthy foods, and losing weight as directed.  Talk with your health care provider about your risk for type 2 diabetes. Ask about any blood tests or screening tests that you need to have. This information is not intended to replace advice given to you by your health care provider. Make sure you discuss any questions you  have with your health care provider. Document Revised: 07/19/2018 Document Reviewed: 05/18/2015 Elsevier Patient Education  2020 [employer].  Managing Your Hypertension Hypertension is commonly called high blood pressure. This is when the force of your blood pressing against the walls of your arteries is too strong. Arteries are blood vessels that carry blood from your heart throughout your body. Hypertension forces the heart to work harder to pump blood, and may cause the arteries to become narrow or stiff. Having untreated or uncontrolled hypertension can cause heart attack, stroke, kidney disease, and other problems. What are blood pressure readings? A blood pressure reading consists of a higher number over a lower number. Ideally, your blood pressure should be below 120/80. The first ("top") number is called the systolic pressure. It is a measure of the pressure in your arteries as your heart beats. The second ("bottom") number is called the diastolic pressure. It is a measure of the pressure in your arteries as the heart relaxes. What does my blood pressure reading mean? Blood pressure is classified into four stages. Based on your blood pressure reading, your health care provider may use the following stages to determine what type of treatment you need, if any. Systolic pressure and diastolic pressure are measured in a unit called mm Hg. Normal  Systolic pressure: below 120.  Diastolic pressure: below 80. Elevated  Systolic pressure: 120-129.  Diastolic pressure: below 80. Hypertension stage 1  Systolic pressure: 130-139.  Diastolic pressure: 80-89. Hypertension stage 2  Systolic pressure: 140 or above.  Diastolic pressure: 90 or above. What health risks are associated with hypertension? Managing your hypertension is an important responsibility. Uncontrolled hypertension can lead to:  A heart attack.  A stroke.  A weakened blood vessel (aneurysm).  Heart  failure.  Kidney damage.  Eye damage.  Metabolic syndrome.  Memory and concentration problems. What changes can I make to manage my hypertension? Hypertension can be managed by making lifestyle changes and possibly by taking medicines. Your health care provider will help you make a plan to bring your blood pressure within a normal range. Eating and drinking   Eat a diet that is high in fiber and potassium, and low in salt (sodium), added sugar, and fat. An example eating plan is called the DASH (Dietary Approaches to Stop Hypertension) diet. To eat this way: ? Eat plenty of fresh fruits and vegetables. Try to fill half of your plate at each meal with fruits and vegetables. ? Eat whole grains, such as whole wheat pasta, brown rice, or whole grain bread.  Fill about one quarter of your plate with whole grains. ? Eat low-fat diary products. ? Avoid fatty cuts of meat, processed or cured meats, and poultry with skin. Fill about one quarter of your plate with lean proteins such as fish, chicken without skin, beans, eggs, and tofu. ? Avoid premade and processed foods. These tend to be higher in sodium, added sugar, and fat.  Reduce your daily sodium intake. Most people with hypertension should eat less than 1,500 mg of sodium a day.  Limit alcohol intake to no more than 1 drink a day for nonpregnant women and 2 drinks a day for men. One drink equals 12 oz of beer, 5 oz of wine, or 1 oz of hard liquor. Lifestyle  Work with your health care provider to maintain a healthy body weight, or to lose weight. Ask what an ideal weight is for you.  Get at least 30 minutes of exercise that causes your heart to beat faster (aerobic exercise) most days of the week. Activities may include walking, swimming, or biking.  Include exercise to strengthen your muscles (resistance exercise), such as weight lifting, as part of your weekly exercise routine. Try to do these types of exercises for 30 minutes at least  3 days a week.  Do not use any products that contain nicotine or tobacco, such as cigarettes and e-cigarettes. If you need help quitting, ask your health care provider.  Control any long-term (chronic) conditions you have, such as high cholesterol or diabetes. Monitoring  Monitor your blood pressure at home as told by your health care provider. Your personal target blood pressure may vary depending on your medical conditions, your age, and other factors.  Have your blood pressure checked regularly, as often as told by your health care provider. Working with your health care provider  Review all the medicines you take with your health care provider because there may be side effects or interactions.  Talk with your health care provider about your diet, exercise habits, and other lifestyle factors that may be contributing to hypertension.  Visit your health care provider regularly. Your health care provider can help you create and adjust your plan for managing hypertension. Will I need medicine to control my blood pressure? Your health care provider may prescribe medicine if lifestyle changes are not enough to get your blood pressure under control, and if:  Your systolic blood pressure is 130 or higher.  Your diastolic blood pressure is 80 or higher. Take medicines only as told by your health care provider. Follow the directions carefully. Blood pressure medicines must be taken as prescribed. The medicine does not work as well when you skip doses. Skipping doses also puts you at risk for problems. Contact a health care provider if:  You think you are having a reaction to medicines you have taken.  You have repeated (recurrent) headaches.  You feel dizzy.  You have swelling in your ankles.  You have trouble with your vision. Get help right away if:  You develop a severe headache or confusion.  You have unusual weakness or numbness, or you feel faint.  You have severe pain in your  chest or abdomen.  You vomit repeatedly.  You have trouble breathing. Summary  Hypertension is when the force of blood pumping through your arteries is too strong. If this condition is not controlled, it may put you at risk for serious complications.  Your personal target blood pressure may vary depending on your medical conditions, your age, and  other factors. For most people, a normal blood pressure is less than 120/80.  Hypertension is managed by lifestyle changes, medicines, or both. Lifestyle changes include weight loss, eating a healthy, low-sodium diet, exercising more, and limiting alcohol. This information is not intended to replace advice given to you by your health care provider. Make sure you discuss any questions you have with your health care provider. Document Revised: 07/19/2018 Document Reviewed: 02/23/2016 Elsevier Patient Education  2020 Elsevier Inc.  High Cholesterol  High cholesterol is a condition in which the blood has high levels of a white, waxy, fat-like substance (cholesterol). The human body needs small amounts of cholesterol. The liver makes all the cholesterol that the body needs. Extra (excess) cholesterol comes from the food that we eat. Cholesterol is carried from the liver by the blood through the blood vessels. If you have high cholesterol, deposits (plaques) may build up on the walls of your blood vessels (arteries). Plaques make the arteries narrower and stiffer. Cholesterol plaques increase your risk for heart attack and stroke. Work with your health care provider to keep your cholesterol levels in a healthy range. What increases the risk? This condition is more likely to develop in people who:  Eat foods that are high in animal fat (saturated fat) or cholesterol.  Are overweight.  Are not getting enough exercise.  Have a family history of high cholesterol. What are the signs or symptoms? There are no symptoms of this condition. How is this  diagnosed? This condition may be diagnosed from the results of a blood test.  If you are older than age 22, your health care provider may check your cholesterol every 4-6 years.  You may be checked more often if you already have high cholesterol or other risk factors for heart disease. The blood test for cholesterol measures:  "Bad" cholesterol (LDL cholesterol). This is the main type of cholesterol that causes heart disease. The desired level for LDL is less than 100.  "Good" cholesterol (HDL cholesterol). This type helps to protect against heart disease by cleaning the arteries and carrying the LDL away. The desired level for HDL is 60 or higher.  Triglycerides. These are fats that the body can store or burn for energy. The desired number for triglycerides is lower than 150.  Total cholesterol. This is a measure of the total amount of cholesterol in your blood, including LDL cholesterol, HDL cholesterol, and triglycerides. A healthy number is less than 200. How is this treated? This condition is treated with diet changes, lifestyle changes, and medicines. Diet changes  This may include eating more whole grains, fruits, vegetables, nuts, and fish.  This may also include cutting back on red meat and foods that have a lot of added sugar. Lifestyle changes  Changes may include getting at least 40 minutes of aerobic exercise 3 times a week. Aerobic exercises include walking, biking, and swimming. Aerobic exercise along with a healthy diet can help you maintain a healthy weight.  Changes may also include quitting smoking. Medicines  Medicines are usually given if diet and lifestyle changes have failed to reduce your cholesterol to healthy levels.  Your health care provider may prescribe a statin medicine. Statin medicines have been shown to reduce cholesterol, which can reduce the risk of heart disease. Follow these instructions at home: Eating and drinking If told by your health care  provider:  Eat chicken (without skin), fish, veal, shellfish, ground [place] breast, and round or loin cuts of red meat.  Do  not eat fried foods or fatty meats, such as hot dogs and salami.  Eat plenty of fruits, such as apples.  Eat plenty of vegetables, such as broccoli, potatoes, and carrots.  Eat beans, peas, and lentils.  Eat grains such as barley, rice, couscous, and bulgur wheat.  Eat pasta without cream sauces.  Use skim or nonfat milk, and eat low-fat or nonfat yogurt and cheeses.  Do not eat or drink whole milk, cream, ice cream, egg yolks, or hard cheeses.  Do not eat stick margarine or tub margarines that contain trans fats (also called partially hydrogenated oils).  Do not eat saturated tropical oils, such as coconut oil and palm oil.  Do not eat cakes, cookies, crackers, or other baked goods that contain trans fats.  General instructions  Exercise as directed by your health care provider. Increase your activity level with activities such as gardening, walking, and taking the stairs.  Take over-the-counter and prescription medicines only as told by your health care provider.  Do not use any products that contain nicotine or tobacco, such as cigarettes and e-cigarettes. If you need help quitting, ask your health care provider.  Keep all follow-up visits as told by your health care provider. This is important. Contact a health care provider if:  You are struggling to maintain a healthy diet or weight.  You need help to start on an exercise program.  You need help to stop smoking. Get help right away if:  You have chest pain.  You have trouble breathing. This information is not intended to replace advice given to you by your health care provider. Make sure you discuss any questions you have with your health care provider. Document Revised: 03/30/2017 Document Reviewed: 09/25/2015 Elsevier Patient Education  2020 [employer].

## 2020-01-01 NOTE — Progress Notes (Signed)
Established Patient Office Visit  Subjective:  Patient ID: Stanley Rice, male    DOB: [birth year]  Age: 29 y.o. MRN: [id number]  CC:  Chief Complaint  Patient presents with   New Patient (Initial Visit)    establish care    HPI Stanley Rice is a 29 yo male who presents to establish care with a primary care provider.  He reports that he has work physical to complete since he did not get his BMI to goal and he was diagnosed as prediabetes:  A1c 5.7, glucose 92, triglycerides 227, LDL 173, HDL 39, total cholesterol 258 creatinine 0.83 GFR 139.   He has no specific complaints.  Prediabetes: A1c 5.7.  Hyperlipidemia: He wants to work on diet and exercise before beginning medication.  BMI 32.69/obesity: His weight has gradually climbed over the last few years. Wt Readings from Last 3 Encounters:  01/01/20 215 lb (97.5 kg)  12/21/17 215 lb (97.5 kg)  05/08/17 210 lb (95.3 kg)    Hypertension: Blood pressure readings at home 142/80 -140/92.  He has been working on diet and exercise well without any trial of medication.  He denies chest pain, shortness of breath, DOE, dizziness, lightheadedness, edema.  BP Readings from Last 3 Encounters:  01/01/20 (!) 140/92  12/21/17 125/84  05/08/17 (!) 160/108    History reviewed. No pertinent surgical history.  History reviewed. No pertinent family history.   Patient presents today for complete physical.  Immunizations: Received Pfizer vaccinations 4/5 and 08/12/2019.  He is not certain about his other vaccines such as Tdap Diet: Not following a healthy diet yet. Exercise: No regular exercise Dentist: UTD Vision: UTD  Social History   Socioeconomic History   Marital status: Married    Spouse name: Not on file   Number of children: Not on file   Years of education: Not on file   Highest education level: Not on file  Occupational History   Occupation: lab assist    Employer: [employer]  Tobacco Use   Smoking status: Never Smoker    Smokeless tobacco: Never Used  Vaping Use   Vaping Use: Never used  Substance and Sexual Activity   Alcohol use: No   Drug use: No   Sexual activity: Yes  Other Topics Concern   Not on file  Social History Narrative   Married with 2 kids, age 21 and 54 yo      Going to school at [employer] for Research scientist (medical). Work as Teacher, English as a foreign language.    Social Determinants of Corporate investment banker Strain:    Difficulty of Paying Living Expenses: Not on file  Food Insecurity:    Worried About Programme researcher, broadcasting/film/video in the Last Year: Not on file   [employer] of Food in the Last Year: Not on file  Transportation Needs:    Lack of Transportation (Medical): Not on file   Lack of Transportation (Non-Medical): Not on file  Physical Activity:    Days of Exercise per Week: Not on file   Minutes of Exercise per Session: Not on file  Stress:    Feeling of Stress : Not on file  Social Connections:    Frequency of Communication with Friends and Family: Not on file   Frequency of Social Gatherings with Friends and Family: Not on file   Attends Religious Services: Not on file   Active Member of Clubs or Organizations: Not on file   Attends Banker Meetings: Not on file  Marital Status: Not on file  Intimate Partner Violence:    Fear of Current or Ex-Partner: Not on file   Emotionally Abused: Not on file   Physically Abused: Not on file   Sexually Abused: Not on file    Outpatient Medications Prior to Visit  Medication Sig Dispense Refill   fexofenadine (ALLEGRA) 180 MG tablet Take 180 mg by mouth daily.     No facility-administered medications prior to visit.    No Known Allergies  Review of Systems  Constitutional: Negative.   HENT: Negative.   Eyes: Negative.   Respiratory: Negative.   Cardiovascular: Negative.   Gastrointestinal: Negative.   Endocrine: Negative.   Genitourinary: Negative.   Musculoskeletal: Negative.   Skin: Negative.        Skin blemishes-  saw DERM before- fungus- treatment. Wants a referral.   Allergic/Immunologic: Negative.   Neurological: Negative for dizziness and headaches.  Hematological: Negative for adenopathy. Does not bruise/bleed easily.  Psychiatric/Behavioral: Negative.       Objective:    Physical Exam Vitals reviewed.  Constitutional:      Appearance: He is obese.  HENT:     Head: Normocephalic and atraumatic.  Eyes:     Conjunctiva/sclera: Conjunctivae normal.     Pupils: Pupils are equal, round, and reactive to light.  Cardiovascular:     Rate and Rhythm: Normal rate and regular rhythm.     Pulses: Normal pulses.     Heart sounds: Normal heart sounds.  Pulmonary:     Effort: Pulmonary effort is normal.     Breath sounds: Normal breath sounds.  Abdominal:     Palpations: Abdomen is soft.     Tenderness: There is no abdominal tenderness.  Musculoskeletal:        General: Normal range of motion.     Cervical back: Normal range of motion.  Skin:    General: Skin is warm and dry.  Neurological:     General: No focal deficit present.     Mental Status: He is alert and oriented to person, place, and time.  Psychiatric:        Mood and Affect: Mood normal.        Behavior: Behavior normal.        Thought Content: Thought content normal.        Judgment: Judgment normal.     BP (!) 140/92 (BP Location: Left Arm, Patient Position: Sitting, Cuff Size: Normal)    Pulse 85    Temp 98.2 F (36.8 C) (Oral)    Ht 5\' 8"  (1.727 m)    Wt 215 lb (97.5 kg)    SpO2 99%    BMI 32.69 kg/m  Wt Readings from Last 3 Encounters:  01/01/20 215 lb (97.5 kg)  12/21/17 215 lb (97.5 kg)  05/08/17 210 lb (95.3 kg)   Pulse Readings from Last 3 Encounters:  01/01/20 85  12/21/17 75  05/08/17 97    BP Readings from Last 3 Encounters:  01/01/20 (!) 140/92  12/21/17 125/84  05/08/17 (!) 160/108    No results found for: CHOL, HDL, LDLCALC, LDLDIRECT, TRIG, CHOLHDL    Health Maintenance Due  Topic Date Due     Hepatitis C Screening  Never done   HIV Screening  Never done   TETANUS/TDAP  Never done   INFLUENZA VACCINE  Never done    There are no preventive care reminders to display for this patient.  No results found for: TSH Lab Results  Component  Value Date   WBC 12.0 (H) 12/21/2017   HGB 14.0 12/21/2017   HCT 41.4 12/21/2017   MCV 85.5 12/21/2017   PLT 207 12/21/2017   Lab Results  Component Value Date   NA 135 12/21/2017   K 3.7 12/21/2017   CO2 25 12/21/2017   GLUCOSE 99 12/21/2017   BUN 11 12/21/2017   CREATININE 0.79 12/21/2017   BILITOT 0.4 12/21/2017   ALKPHOS 59 12/21/2017   AST 18 12/21/2017   ALT 28 12/21/2017   PROT 8.0 12/21/2017   ALBUMIN 4.4 12/21/2017   CALCIUM 8.9 12/21/2017   ANIONGAP 8 12/21/2017   No results found for: CHOL No results found for: HDL No results found for: LDLCALC No results found for: TRIG No results found for: CHOLHDL No results found for: QJJH4R    Assessment & Plan:   Problem List Items Addressed This Visit      Cardiovascular and Mediastinum   Essential hypertension     Other   Encounter for medical examination to establish care - Primary   Class 1 obesity due to excess calories with serious comorbidity and body mass index (BMI) of 32.0 to 32.9 in adult   Relevant Orders   Amb Ref to Medical Weight Management   Mixed hyperlipidemia   Pre-diabetes      No orders of the defined types were placed in this encounter. Pt advised:  Please continue to work on healthy diet and exercise-30 minutes 3-5 times a week.  Walking is a good source, or any kind of physical activity that you enjoy doing.  Healthy diet is one low in sugar, simple carbohydrates, fats, fast food, restaurant meals.  Focus on lean protein, nonfat or low-fat dairy products only, lots of vegetables, fruits, canola or olive oil- small amounts.   Purchase a blood pressure arm cuff-check random blood pressures write this down and call us.  Blood pressure  remains above 130/80 or above, you do need to start medication while you are working on lifestyle change.    A normal blood pressure is less than 120/80.  That is where you ideally should be at age 34 years old.  Your cholesterol panel was also elevated, your BMI is 32 with goal 25, and you have prediabetes with 52-month average blood sugar 5.7.  Elevated blood pressure with new diagnosis of hypertension.  All of this is treated with diet,  Exercise,  and medication as needed.  Your BP was 150/110 repeated. I recommend starting you on a low dose BP medicine-like  losartan 25 mg daily. You want to change your lifestyle and  check your BP at home and bring in readings to discuss in a month.    I have placed referral into the medical weight loss group and they will help you get started on a plan that will work for you.  A total of 45 minutes was spent with patient more than half of which was spent in counseling patient on the above mentioned issues , reviewing and explaining recent labs and imaging studies done, and coordination of care.  Follow-up: Return in about 3 months (around 04/01/2020).   This visit occurred during the SARS-CoV-2 public health emergency.  Safety protocols were in place, including screening questions prior to the visit, additional usage of staff PPE, and extensive cleaning of exam room while observing appropriate contact time as indicated for disinfecting solutions.   Amedeo Kinsman, NP

## 2020-01-26 ENCOUNTER — Other Ambulatory Visit: Payer: Self-pay

## 2020-01-26 ENCOUNTER — Encounter (INDEPENDENT_AMBULATORY_CARE_PROVIDER_SITE_OTHER): Payer: Self-pay | Admitting: Family Medicine

## 2020-01-26 ENCOUNTER — Ambulatory Visit (INDEPENDENT_AMBULATORY_CARE_PROVIDER_SITE_OTHER): Payer: Managed Care, Other (non HMO) | Admitting: Family Medicine

## 2020-01-26 VITALS — BP 155/87 | HR 76 | Temp 98.4°F | Ht 67.0 in | Wt 199.0 lb

## 2020-01-26 DIAGNOSIS — R0602 Shortness of breath: Secondary | ICD-10-CM

## 2020-01-26 DIAGNOSIS — I10 Essential (primary) hypertension: Secondary | ICD-10-CM | POA: Diagnosis not present

## 2020-01-26 DIAGNOSIS — Z0289 Encounter for other administrative examinations: Secondary | ICD-10-CM

## 2020-01-26 DIAGNOSIS — R7303 Prediabetes: Secondary | ICD-10-CM

## 2020-01-26 DIAGNOSIS — E669 Obesity, unspecified: Secondary | ICD-10-CM

## 2020-01-26 DIAGNOSIS — Z1331 Encounter for screening for depression: Secondary | ICD-10-CM

## 2020-01-26 DIAGNOSIS — Z6831 Body mass index (BMI) 31.0-31.9, adult: Secondary | ICD-10-CM

## 2020-01-26 DIAGNOSIS — R5383 Other fatigue: Secondary | ICD-10-CM | POA: Diagnosis not present

## 2020-01-26 DIAGNOSIS — Z9189 Other specified personal risk factors, not elsewhere classified: Secondary | ICD-10-CM | POA: Diagnosis not present

## 2020-01-27 LAB — T4: T4, Total: 7 ug/dL (ref 4.5–12.0)

## 2020-01-27 LAB — CBC WITH DIFFERENTIAL/PLATELET
Basophils Absolute: 0 10*3/uL (ref 0.0–0.2)
Basos: 1 %
EOS (ABSOLUTE): 0 10*3/uL (ref 0.0–0.4)
Eos: 1 %
Hematocrit: 44 % (ref 37.5–51.0)
Hemoglobin: 14.2 g/dL (ref 13.0–17.7)
Immature Grans (Abs): 0 10*3/uL (ref 0.0–0.1)
Immature Granulocytes: 0 %
Lymphocytes Absolute: 1.9 10*3/uL (ref 0.7–3.1)
Lymphs: 29 %
MCH: 28.5 pg (ref 26.6–33.0)
MCHC: 32.3 g/dL (ref 31.5–35.7)
MCV: 88 fL (ref 79–97)
Monocytes Absolute: 0.5 10*3/uL (ref 0.1–0.9)
Monocytes: 7 %
Neutrophils Absolute: 4.3 10*3/uL (ref 1.4–7.0)
Neutrophils: 62 %
Platelets: 262 10*3/uL (ref 150–450)
RBC: 4.99 x10E6/uL (ref 4.14–5.80)
RDW: 13.3 % (ref 11.6–15.4)
WBC: 6.7 10*3/uL (ref 3.4–10.8)

## 2020-01-27 LAB — COMPREHENSIVE METABOLIC PANEL
ALT: 37 IU/L (ref 0–44)
AST: 27 IU/L (ref 0–40)
Albumin/Globulin Ratio: 1.7 (ref 1.2–2.2)
Albumin: 5 g/dL (ref 4.1–5.2)
Alkaline Phosphatase: 83 IU/L (ref 44–121)
BUN/Creatinine Ratio: 9 (ref 9–20)
BUN: 8 mg/dL (ref 6–20)
Bilirubin Total: 0.4 mg/dL (ref 0.0–1.2)
CO2: 27 mmol/L (ref 20–29)
Calcium: 9.7 mg/dL (ref 8.7–10.2)
Chloride: 100 mmol/L (ref 96–106)
Creatinine, Ser: 0.87 mg/dL (ref 0.76–1.27)
GFR calc Af Amer: 137 mL/min/{1.73_m2} (ref >59)
GFR calc non Af Amer: 118 mL/min/{1.73_m2} (ref >59)
Globulin, Total: 2.9 g/dL (ref 1.5–4.5)
Glucose: 81 mg/dL (ref 65–99)
Potassium: 4.8 mmol/L (ref 3.5–5.2)
Sodium: 140 mmol/L (ref 134–144)
Total Protein: 7.9 g/dL (ref 6.0–8.5)

## 2020-01-27 LAB — INSULIN, RANDOM: INSULIN: 9.6 u[IU]/mL (ref 2.6–24.9)

## 2020-01-27 LAB — VITAMIN B12: Vitamin B-12: 1181 pg/mL (ref 232–1245)

## 2020-01-27 LAB — FOLATE: Folate: 15.4 ng/mL (ref >3.0)

## 2020-01-27 LAB — T3: T3, Total: 94 ng/dL (ref 71–180)

## 2020-01-27 LAB — TSH: TSH: 1.31 u[IU]/mL (ref 0.450–4.500)

## 2020-01-27 LAB — VITAMIN D 25 HYDROXY (VIT D DEFICIENCY, FRACTURES): Vit D, 25-Hydroxy: 27.2 ng/mL — ABNORMAL LOW (ref 30.0–100.0)

## 2020-01-31 ENCOUNTER — Encounter (INDEPENDENT_AMBULATORY_CARE_PROVIDER_SITE_OTHER): Payer: Self-pay | Admitting: Family Medicine

## 2020-02-02 ENCOUNTER — Encounter: Payer: Self-pay | Admitting: Nurse Practitioner

## 2020-02-02 ENCOUNTER — Telehealth (INDEPENDENT_AMBULATORY_CARE_PROVIDER_SITE_OTHER): Payer: Managed Care, Other (non HMO) | Admitting: Nurse Practitioner

## 2020-02-02 ENCOUNTER — Other Ambulatory Visit: Payer: Self-pay

## 2020-02-02 VITALS — Ht 67.0 in | Wt 204.0 lb

## 2020-02-02 DIAGNOSIS — Z6832 Body mass index (BMI) 32.0-32.9, adult: Secondary | ICD-10-CM | POA: Diagnosis not present

## 2020-02-02 DIAGNOSIS — I10 Essential (primary) hypertension: Secondary | ICD-10-CM

## 2020-02-02 DIAGNOSIS — E6609 Other obesity due to excess calories: Secondary | ICD-10-CM | POA: Diagnosis not present

## 2020-02-02 NOTE — Progress Notes (Signed)
Virtual Visit via telephone  Note  This visit type was conducted due to national recommendations for restrictions regarding the COVID-19 pandemic (e.g. social distancing).  This format is felt to be most appropriate for this patient at this time.  All issues noted in this document were discussed and addressed.  No physical exam was performed (except for noted visual exam findings with Video Visits).   I connected with@ on 02/02/20 at 11:00 AM EDT by a video enabled telemedicine application or telephone and verified that I am speaking with the correct person using two identifiers. Location patient: car Location provider: work or home office Persons participating in the virtual visit: patient, provider  I discussed the limitations, risks, security and privacy concerns of performing an evaluation and management service by telephone and the availability of in person appointments. I also discussed with the patient that there may be a patient responsible charge related to this service. The patient expressed understanding and agreed to proceed.  Interactive audio and video telecommunications were attempted between this provider and patient, however failed, due to patient having technical difficulties.  We continued and completed visit with audio only.  Reason for visit: He established care last month and presents for a HTN  follow up  HPI: HTN: To review BP at homes and decide on starting meds.He gets nervous in doctors office and at home they run much lower.  He has been working on lifestyle changes.  He did purchase a blood pressure cuff and is getting blood pressures at home 134/93-125/86.  He has opted to not start medication and work on lifestyle for the next 3 months.  BP Readings from Last 3 Encounters:  01/26/20 (!) 155/87  01/01/20 (!) 140/92  12/21/17 125/84    Prediabetes: A1c 5.7.  Hyperlipidemia: He wants to work on diet and exercise before beginning medication. [facility].  He went to the weight loss appt and started on their diet plan. He weighed 199 lbs and has gained weight on the plan.He thinks he is eating more carbs than he usually does. Going to the gym 4-5 days a week runs 30 min and light weights and running.    Wt Readings from Last 3 Encounters:  02/02/20 204 lb (92.5 kg)  01/26/20 199 lb (90.3 kg)  01/01/20 215 lb (97.5 kg)   Vit D3: Vit D level 27 and advised  800 IU  daily  Immunizations:Vaccines had Hep B series with his job in 2019, tetanus  2012 - due next year.  Pfizer booster- due in Nov.    ROS: Review of Systems  Constitutional: Negative for fever and weight loss.  HENT: Negative.   Respiratory: Negative.   Cardiovascular: Negative.   Gastrointestinal: Negative.   Genitourinary: Negative.   Musculoskeletal: Negative.   Skin: Negative for rash.  Psychiatric/Behavioral: Negative.     Past Medical History:  Diagnosis Date  . High blood sugar   . High cholesterol   . Hypertension   . Lactose intolerance   . Prediabetes     No past surgical history on file.  Family History  Problem Relation Age of Onset  . Miscarriages / [place] Mother   . Diabetes Mother   . Alcohol abuse Father   . Hypertension Father   . Anxiety disorder Father   . Alcoholism Father   . Obesity Father     SOCIAL HX: Negative alcohol, tobacco   Current Outpatient Medications:  .  fexofenadine (ALLEGRA) 180 MG tablet, Take 180 mg  by mouth daily., Disp: , Rfl:   EXAM: Patient was seen on the screen while we were working on getting the sound.   VITALS per patient if applicable:Wt 204 lbs, 01/23/20:  128/86  GENERAL: alert, oriented, appears well and in no acute distress  HEENT: atraumatic, conjunctiva clear, no obvious abnormalities on inspection of external nose and ears  NECK: normal movements of the head and neck  LUNGS: on inspection no signs of respiratory distress, breathing rate appears normal, no obvious gross SOB, gasping or  wheezing  CV: no obvious cyanosis  MS: moves all visible extremities without noticeable abnormality  PSYCH/NEURO: pleasant and cooperative, no obvious depression or anxiety, speech and thought processing grossly intact  ASSESSMENT AND PLAN:  Discussed the following assessment and plan:  Essential hypertension  Class 1 obesity due to excess calories with serious comorbidity and body mass index (BMI) of 32.0 to 32.9 in adult    Pt advised:  Continue with the [facility] and discuss recent weight gain with their team as you think you may be getting more calories than you are burning.  Continue with the gym activity.  You are doing a very good job.  We reviewed the labs that [facility] performed.  You may take additional vitamin D 3 supplement 800 to 1000 IU daily.  You have prediabetes and are working on weight loss and exercise which will help.   You purchased  a blood pressure cuff and please check the blood pressure randomly about 3 times a week or so keep a record.  We can see you in follow-up in 3 months see how your BP is running.  We discussed adding a low-dose blood pressure medicine if needed.    I discussed the assessment and treatment plan with the patient. The patient was provided an opportunity to ask questions and all were answered. The patient agreed with the plan and demonstrated an understanding of the instructions.   The patient was advised to call back or seek an in-person evaluation if the symptoms worsen or if the condition fails to improve as anticipated.  Follow up in 3 months .   I provided 12 minutes of time during this encounter.  Amedeo Kinsman, NP Adult Nurse Practitioner [facility] [employer] [phone]

## 2020-02-02 NOTE — Patient Instructions (Addendum)
Continue with the [facility] and discuss recent weight gain with their team as you think you may be getting more calories than you are burning.  Continue with the gym activity.  You are doing a very good job.  We reviewed the labs that [facility] performed.  You may take additional vitamin D 3 supplement 800 to 1000 IU daily.  You have prediabetes and are working on weight loss and exercise which will help.   You purchased  a blood pressure cuff and please check the blood pressure randomly about 3 times a week or so keep a record.  We can see you in follow-up in 3 months see how your BP is running.  We discussed adding a low-dose blood pressure medicine if needed.    Managing Your Hypertension Hypertension is commonly called high blood pressure. This is when the force of your blood pressing against the walls of your arteries is too strong. Arteries are blood vessels that carry blood from your heart throughout your body. Hypertension forces the heart to work harder to pump blood, and may cause the arteries to become narrow or stiff. Having untreated or uncontrolled hypertension can cause heart attack, stroke, kidney disease, and other problems. What are blood pressure readings? A blood pressure reading consists of a higher number over a lower number. Ideally, your blood pressure should be below 120/80. The first ("top") number is called the systolic pressure. It is a measure of the pressure in your arteries as your heart beats. The second ("bottom") number is called the diastolic pressure. It is a measure of the pressure in your arteries as the heart relaxes. What does my blood pressure reading mean? Blood pressure is classified into four stages. Based on your blood pressure reading, your health care provider may use the following stages to determine what type of treatment you need, if any. Systolic pressure and diastolic pressure are measured in a unit called mm Hg. Normal  Systolic pressure:  below 120.  Diastolic pressure: below 80. Elevated  Systolic pressure: 120-129.  Diastolic pressure: below 80. Hypertension stage 1  Systolic pressure: 130-139.  Diastolic pressure: 80-89. Hypertension stage 2  Systolic pressure: 140 or above.  Diastolic pressure: 90 or above. What health risks are associated with hypertension? Managing your hypertension is an important responsibility. Uncontrolled hypertension can lead to:  A heart attack.  A stroke.  A weakened blood vessel (aneurysm).  Heart failure.  Kidney damage.  Eye damage.  Metabolic syndrome.  Memory and concentration problems. What changes can I make to manage my hypertension? Hypertension can be managed by making lifestyle changes and possibly by taking medicines. Your health care provider will help you make a plan to bring your blood pressure within a normal range. Eating and drinking   Eat a diet that is high in fiber and potassium, and low in salt (sodium), added sugar, and fat. An example eating plan is called the DASH (Dietary Approaches to Stop Hypertension) diet. To eat this way: ? Eat plenty of fresh fruits and vegetables. Try to fill half of your plate at each meal with fruits and vegetables. ? Eat whole grains, such as whole wheat pasta, brown rice, or whole grain bread. Fill about one quarter of your plate with whole grains. ? Eat low-fat diary products. ? Avoid fatty cuts of meat, processed or cured meats, and poultry with skin. Fill about one quarter of your plate with lean proteins such as fish, chicken without skin, beans, eggs, and  tofu. ? Avoid premade and processed foods. These tend to be higher in sodium, added sugar, and fat.  Reduce your daily sodium intake. Most people with hypertension should eat less than 1,500 mg of sodium a day.  Limit alcohol intake to no more than 1 drink a day for nonpregnant women and 2 drinks a day for men. One drink equals 12 oz of beer, 5 oz of wine, or 1  oz of hard liquor. Lifestyle  Work with your health care provider to maintain a healthy body weight, or to lose weight. Ask what an ideal weight is for you.  Get at least 30 minutes of exercise that causes your heart to beat faster (aerobic exercise) most days of the week. Activities may include walking, swimming, or biking.  Include exercise to strengthen your muscles (resistance exercise), such as weight lifting, as part of your weekly exercise routine. Try to do these types of exercises for 30 minutes at least 3 days a week.  Do not use any products that contain nicotine or tobacco, such as cigarettes and e-cigarettes. If you need help quitting, ask your health care provider.  Control any long-term (chronic) conditions you have, such as high cholesterol or diabetes. Monitoring  Monitor your blood pressure at home as told by your health care provider. Your personal target blood pressure may vary depending on your medical conditions, your age, and other factors.  Have your blood pressure checked regularly, as often as told by your health care provider. Working with your health care provider  Review all the medicines you take with your health care provider because there may be side effects or interactions.  Talk with your health care provider about your diet, exercise habits, and other lifestyle factors that may be contributing to hypertension.  Visit your health care provider regularly. Your health care provider can help you create and adjust your plan for managing hypertension. Will I need medicine to control my blood pressure? Your health care provider may prescribe medicine if lifestyle changes are not enough to get your blood pressure under control, and if:  Your systolic blood pressure is 130 or higher.  Your diastolic blood pressure is 80 or higher. Take medicines only as told by your health care provider. Follow the directions carefully. Blood pressure medicines must be taken as  prescribed. The medicine does not work as well when you skip doses. Skipping doses also puts you at risk for problems. Contact a health care provider if:  You think you are having a reaction to medicines you have taken.  You have repeated (recurrent) headaches.  You feel dizzy.  You have swelling in your ankles.  You have trouble with your vision. Get help right away if:  You develop a severe headache or confusion.  You have unusual weakness or numbness, or you feel faint.  You have severe pain in your chest or abdomen.  You vomit repeatedly.  You have trouble breathing. Summary  Hypertension is when the force of blood pumping through your arteries is too strong. If this condition is not controlled, it may put you at risk for serious complications.  Your personal target blood pressure may vary depending on your medical conditions, your age, and other factors. For most people, a normal blood pressure is less than 120/80.  Hypertension is managed by lifestyle changes, medicines, or both. Lifestyle changes include weight loss, eating a healthy, low-sodium diet, exercising more, and limiting alcohol. This information is not intended to replace advice given to  you by your health care provider. Make sure you discuss any questions you have with your health care provider. Document Revised: 07/19/2018 Document Reviewed: 02/23/2016 Elsevier Patient Education  2020 [employer].

## 2020-02-03 NOTE — Progress Notes (Signed)
Dear Stanley Kinsman, NP,   Thank you for referring Stanley Rice to our clinic. The following note includes my evaluation and treatment recommendations.  Chief Complaint:   OBESITY Stanley Rice (MR# [id number]) is a 29 y.o. male who presents for evaluation and treatment of obesity and related comorbidities. Current BMI is Body mass index is 31.17 kg/m.Marland Kitchen Stanley Rice has been struggling with his weight for many years and has been unsuccessful in either losing weight, maintaining weight loss, or reaching his healthy weight goal.  Stanley Rice is currently in the action stage of change and ready to dedicate time achieving and maintaining a healthier weight. Stanley Rice is interested in becoming our patient and working on intensive lifestyle modifications including (but not limited to) diet and exercise for weight loss.  Stanley Rice is referred by Stanley Kinsman, NP. He is lactose intolerant only if too much. He has a 34-year-old and a 53-year-old at home - both birthdays in April. He reports eating out 2-4 times per week. Breakfast is oatmeal (overnight with oat milk) with 2 hard boiled eggs and carrots and feels satisfied; Lunch is 1/2 breast grilled chicken with 1 cup of asparagus or broccoli and feels full; Dinner is 1/2 chicken breast with vegetables.  Stanley Rice's habits were reviewed today and are as follows: His family eats meals together, his desired weight loss is 19-29 lbs, he started gaining weight with college and marriage, his heaviest weight ever was 225 pounds, he craves pastries and sweets, and he sometimes eats larger portions than normal.  Depression Screen Stanley Rice's Food and Mood (modified PHQ-9) score was 6.  Depression screen PHQ 2/9 01/26/2020  Decreased Interest 1  Down, Depressed, Hopeless 0  PHQ - 2 Score 1  Altered sleeping 1  Tired, decreased energy 2  Change in appetite 0  Feeling bad or failure about yourself  0  Trouble concentrating 1  Moving slowly or fidgety/restless 0  Suicidal thoughts 0  PHQ-9  Score 5  Difficult doing work/chores Somewhat difficult   Subjective:   Other fatigue. Stanley Rice denies daytime somnolence and admits to waking up still tired. Stanley Rice generally gets 7 hours of sleep per night, and states that he does not sleep well most nights. Snoring is present. Apneic episodes are not present. Epworth Sleepiness Score is 1.  SOB (shortness of breath) on exertion. Stanley Rice notes increasing shortness of breath with exercising and seems to be worsening over time with weight gain. He notes getting out of breath sooner with activity than he used to. This has gotten worse recently. Stanley Rice denies shortness of breath at rest or orthopnea.  Essential hypertension. Hypertension was diagnosed ~1 year ago. Stanley Rice is on no medication and doesn't want to be.  BP Readings from Last 3 Encounters:  01/26/20 (!) 155/87  01/01/20 (!) 140/92  12/21/17 125/84   Lab Results  Component Value Date   CREATININE 0.79 12/21/2017   Pre-diabetes. Stanley Rice has a diagnosis of prediabetes based on his elevated HgA1c and was informed this puts him at greater risk of developing diabetes. He continues to work on diet and exercise to decrease his risk of diabetes. He denies nausea or hypoglycemia. This is a recent diagnosis. Stanley Rice is on no medication. Labs from August 2021 showed an A1c of 5.7.  Depression screening. Stanley Rice had a mildly positive depression screen with a PHQ-9 score of 6.  At risk for diabetes mellitus. Stanley Rice is at higher than average risk for developing diabetes due to his obesity.   Assessment/Plan:   Other  fatigue. Stanley Rice does feel that his weight is causing his energy to be lower than it should be. Fatigue may be related to obesity, depression or many other causes. Labs will be ordered, and in the meanwhile, Stanley Rice will focus on self care including making healthy food choices, increasing physical activity and focusing on stress reduction. EKG 12-Lead, revealed NSR at 82 BPM. Fasting lipid panel showed total  cholesterol 255, HDL 39, LDL 173, and triglycerides 227. Vitamin B12, Folate, VITAMIN D 25 Hydroxy (Vit-D Deficiency, Fractures), TSH, T4, T3 labs will be checked today.   SOB (shortness of breath) on exertion. Stanley Rice does feel that he gets out of breath more easily that he used to when he exercises. Stanley Rice's shortness of breath appears to be obesity related and exercise induced. He has agreed to work on weight loss and gradually increase exercise to treat his exercise induced shortness of breath. Will continue to monitor closely.   Essential hypertension. Stanley Rice is working on healthy weight loss and exercise to improve blood pressure control. We will watch for signs of hypotension as he continues his lifestyle modifications. EKG 12-Lead performed. Labs will be checked today.   Pre-diabetes. Stanley Rice will continue to work on weight loss, exercise, and decreasing simple carbohydrates to help decrease the risk of diabetes.  Insulin, random level will be checked today.   Depression screening. Stanley Rice had a positive depression screening. Depression is commonly associated with obesity and often results in emotional eating behaviors. We will monitor this closely and work on CBT to help improve the non-hunger eating patterns. Referral to Psychology may be required if no improvement is seen as he continues in our clinic.  At risk for diabetes mellitus. Stanley Rice was given approximately 15 minutes of diabetes education and counseling today. We discussed intensive lifestyle modifications today with an emphasis on weight loss as well as increasing exercise and decreasing simple carbohydrates in his diet. We also reviewed medication options with an emphasis on risk versus benefit of those discussed.   Repetitive spaced learning was employed today to elicit superior memory formation and behavioral change.  Class 1 obesity with serious comorbidity and body mass index (BMI) of 31.0 to 31.9 in adult, unspecified obesity type.  Stanley Rice  is currently in the action stage of change and his goal is to continue with weight loss efforts. I recommend Stanley Rice begin the structured treatment plan as follows:  He has agreed to the Category 3 Plan.  Exercise goals: Stanley Rice will continue his current exercise regimen.    Behavioral modification strategies: increasing lean protein intake, meal planning and cooking strategies, keeping healthy foods in the home and planning for success.  He was informed of the importance of frequent follow-up visits to maximize his success with intensive lifestyle modifications for his multiple health conditions. He was informed we would discuss his lab results at his next visit unless there is a critical issue that needs to be addressed sooner. Stanley Rice agreed to keep his next visit at the agreed upon time to discuss these results.  Objective:   Blood pressure (!) 155/87, pulse 76, temperature 98.4 F (36.9 C), temperature source Oral, height 5\' 7"  (1.702 m), weight 199 lb (90.3 kg), SpO2 98 %. Body mass index is 31.17 kg/m.  EKG: Sinus  Rhythm with a rate of 82 BPM. Right axis - may be normal for age. Poor R-wave progression - nondiagnostic for this age. Abnormal.  Indirect Calorimeter completed today shows a VO2 of 271 and a REE of 1889.  His calculated basal metabolic rate is 8416 thus his basal metabolic rate is worse than expected.  General: Cooperative, alert, well developed, in no acute distress. HEENT: Conjunctivae and lids unremarkable. Cardiovascular: Regular rhythm.  Lungs: Normal work of breathing. Neurologic: No focal deficits.   Attestation Statements:   Reviewed by clinician on day of visit: allergies, medications, problem list, medical history, surgical history, family history, social history, and previous encounter notes.  I, Marianna Payment, am acting as Energy manager for Reuben Likes, MD  This is the patient's first visit at [facility]. The patient's NEW PATIENT PACKET  was reviewed at length. Included in the packet: current and past health history, medications, allergies, ROS, gynecologic history (women only), surgical history, family history, social history, weight history, weight loss surgery history (for those that have had weight loss surgery), nutritional evaluation, mood and food questionnaire, PHQ9, Epworth questionnaire, sleep habits questionnaire, patient life and health improvement goals questionnaire. These will all be scanned into the patient's chart under media.   During the visit, I independently reviewed the patient's EKG, bioimpedance scale results, and indirect calorimeter results. I used this information to tailor a meal plan for the patient that will help him to lose weight and will improve his obesity-related conditions going forward. I performed a medically necessary appropriate examination and/or evaluation. I discussed the assessment and treatment plan with the patient. The patient was provided an opportunity to ask questions and all were answered. The patient agreed with the plan and demonstrated an understanding of the instructions. Labs were ordered at this visit and will be reviewed at the next visit unless more critical results need to be addressed immediately. Clinical information was updated and documented in the EMR.   Time spent on visit including pre-visit chart review and post-visit care was 45 minutes.   A separate 15 minutes was spent on risk counseling (see above).   I have reviewed the above documentation for accuracy and completeness, and I agree with the above. - Katherina Mires, MD

## 2020-02-09 ENCOUNTER — Ambulatory Visit (INDEPENDENT_AMBULATORY_CARE_PROVIDER_SITE_OTHER): Payer: Managed Care, Other (non HMO) | Admitting: Family Medicine

## 2020-02-09 ENCOUNTER — Other Ambulatory Visit: Payer: Self-pay

## 2020-02-09 ENCOUNTER — Encounter (INDEPENDENT_AMBULATORY_CARE_PROVIDER_SITE_OTHER): Payer: Self-pay | Admitting: Family Medicine

## 2020-02-09 VITALS — BP 124/88 | HR 60 | Temp 98.3°F | Ht 67.0 in | Wt 201.0 lb

## 2020-02-09 DIAGNOSIS — Z9189 Other specified personal risk factors, not elsewhere classified: Secondary | ICD-10-CM

## 2020-02-09 DIAGNOSIS — R7303 Prediabetes: Secondary | ICD-10-CM

## 2020-02-09 DIAGNOSIS — E7849 Other hyperlipidemia: Secondary | ICD-10-CM | POA: Diagnosis not present

## 2020-02-09 DIAGNOSIS — E669 Obesity, unspecified: Secondary | ICD-10-CM

## 2020-02-09 DIAGNOSIS — E559 Vitamin D deficiency, unspecified: Secondary | ICD-10-CM | POA: Diagnosis not present

## 2020-02-09 DIAGNOSIS — Z6831 Body mass index (BMI) 31.0-31.9, adult: Secondary | ICD-10-CM

## 2020-02-09 MED ORDER — VITAMIN D (ERGOCALCIFEROL) 1.25 MG (50000 UNIT) PO CAPS: 50000.0000 [IU] | ORAL_CAPSULE | ORAL | 0 refills | Status: DC

## 2020-02-10 NOTE — Progress Notes (Signed)
Chief Complaint:   OBESITY Stanley Rice is here to discuss his progress with his obesity treatment plan along with follow-up of his obesity related diagnoses. Stanley Rice is on the Category 3 Plan and states he is following his eating plan approximately 70% of the time. Stanley Rice states he is at the gym for 45-100 minutes 5 times per week.  Today's visit was #: 2 Starting weight: 199 lbs Starting date: 01/26/2020 Today's weight: 201 lbs Today's date: 02/09/2020 Total lbs lost to date: 0 Total lbs lost since last in-office visit: 0  Interim History: [name] initially ate 10 oz of meat at dinner but felt it was too much. Then dropped down to 8 oz and felt it was still too much sometimes. He ultimately decreased to 5 oz of meat and 2 cups of veggies and took out bread at breakfast. He is worried since his home scale showed weight gain. He did choose indulgent foods for snack calories, but stayed within 300 calories. He is going out of town next week to [place].  Subjective:   1. Pre-diabetes Bascom's A1c in August 2021 was of 5.7. and insulin 9.6. I discussed labs with the patient today.  2. Vitamin D deficiency Frantz denies nausea, vomiting, or muscle weakness, but notes fatigue. He is not on Vit D. I discussed labs with the patient today.  3. Other hyperlipidemia [name]'s last LDL was 173, HDL 39, and triglycerides 191. He is not on medications, and notes a family history of CAD.  4. At risk for diabetes mellitus Stanley Rice is at higher than average risk for developing diabetes due to his obesity.   Assessment/Plan:   1. Pre-diabetes Stanley Rice will continue to work on weight loss, exercise, and decreasing simple carbohydrates to help decrease the risk of diabetes. We will follow up on labs in 3 months. No medications at this time, but if hunger increase or cravings increases then we will discuss starting metformin.  2. Vitamin D deficiency Low Vitamin D level contributes to fatigue and are associated with obesity,  breast, and colon cancer. Stanley Rice agreed to start prescription Vitamin D 50,000 IU every week with no refills. He will follow-up for routine testing of Vitamin D, at least 2-3 times per year to avoid over-replacement.  - Vitamin D, Ergocalciferol, (DRISDOL) 1.25 MG (50000 UNIT) CAPS capsule; Take 1 capsule (50,000 Units total) by mouth every 7 (seven) days.  Dispense: 4 capsule; Refill: 0  3. Other hyperlipidemia Cardiovascular risk and specific lipid/LDL goals reviewed. We discussed several lifestyle modifications today and [name] will continue to work on diet, exercise and weight loss efforts. We will repeat FLP in 3 months, may need to consider statin if his LDL is still elevated. Orders and follow up as documented in patient record.   Counseling Intensive lifestyle modifications are the first line treatment for this issue. . Dietary changes: Increase soluble fiber. Decrease simple carbohydrates. . Exercise changes: Moderate to vigorous-intensity aerobic activity 150 minutes per week if tolerated. . Lipid-lowering medications: see documented in medical record.  4. At risk for diabetes mellitus Stanley Rice was given approximately 30 minutes of diabetes education and counseling today. We discussed intensive lifestyle modifications today with an emphasis on weight loss as well as increasing exercise and decreasing simple carbohydrates in his diet. We also reviewed medication options with an emphasis on risk versus benefit of those discussed.   Repetitive spaced learning was employed today to elicit superior memory formation and behavioral change.  5. Class 1 obesity with serious  comorbidity and body mass index (BMI) of 31.0 to 31.9 in adult, unspecified obesity type Stanley Rice is currently in the action stage of change. As such, his goal is to continue with weight loss efforts. He has agreed to keeping a food journal and adhering to recommended goals of 1450-1600 calories and 95+ grams of protein daily.    Exercise goals: As is.  Behavioral modification strategies: increasing lean protein intake, meal planning and cooking strategies, keeping healthy foods in the home, planning for success and keeping a strict food journal.  [name] has agreed to follow-up with our clinic in 2 weeks. He was informed of the importance of frequent follow-up visits to maximize his success with intensive lifestyle modifications for his multiple health conditions.   Objective:   Blood pressure 124/88, pulse 60, temperature 98.3 F (36.8 C), temperature source Oral, height 5\' 7"  (1.702 m), weight 201 lb (91.2 kg), SpO2 98 %. Body mass index is 31.48 kg/m.  General: Cooperative, alert, well developed, in no acute distress. HEENT: Conjunctivae and lids unremarkable. Cardiovascular: Regular rhythm.  Lungs: Normal work of breathing. Neurologic: No focal deficits.   Lab Results  Component Value Date   CREATININE 0.87 01/26/2020   BUN 8 01/26/2020   NA 140 01/26/2020   K 4.8 01/26/2020   CL 100 01/26/2020   CO2 27 01/26/2020   Lab Results  Component Value Date   ALT 37 01/26/2020   AST 27 01/26/2020   ALKPHOS 83 01/26/2020   BILITOT 0.4 01/26/2020   No results found for: HGBA1C Lab Results  Component Value Date   INSULIN 9.6 01/26/2020   Lab Results  Component Value Date   TSH 1.310 01/26/2020   No results found for: CHOL, HDL, LDLCALC, LDLDIRECT, TRIG, CHOLHDL Lab Results  Component Value Date   WBC 6.7 01/26/2020   HGB 14.2 01/26/2020   HCT 44.0 01/26/2020   MCV 88 01/26/2020   PLT 262 01/26/2020   No results found for: IRON, TIBC, FERRITIN  Attestation Statements:   Reviewed by clinician on day of visit: allergies, medications, problem list, medical history, surgical history, family history, social history, and previous encounter notes.   I, 01/28/2020, am acting as transcriptionist for Burt Knack, MD.  I have reviewed the above documentation for accuracy and completeness,  and I agree with the above. - Reuben Likes, MD

## 2020-02-23 ENCOUNTER — Encounter (INDEPENDENT_AMBULATORY_CARE_PROVIDER_SITE_OTHER): Payer: Self-pay | Admitting: Physician Assistant

## 2020-02-23 ENCOUNTER — Ambulatory Visit (INDEPENDENT_AMBULATORY_CARE_PROVIDER_SITE_OTHER): Payer: Managed Care, Other (non HMO) | Admitting: Physician Assistant

## 2020-02-23 ENCOUNTER — Other Ambulatory Visit: Payer: Self-pay

## 2020-02-23 VITALS — BP 131/79 | HR 65 | Temp 98.6°F | Ht 67.0 in | Wt 200.0 lb

## 2020-02-23 DIAGNOSIS — R7303 Prediabetes: Secondary | ICD-10-CM | POA: Diagnosis not present

## 2020-02-23 DIAGNOSIS — E669 Obesity, unspecified: Secondary | ICD-10-CM

## 2020-02-23 DIAGNOSIS — Z9189 Other specified personal risk factors, not elsewhere classified: Secondary | ICD-10-CM

## 2020-02-23 DIAGNOSIS — E559 Vitamin D deficiency, unspecified: Secondary | ICD-10-CM

## 2020-02-23 DIAGNOSIS — Z6831 Body mass index (BMI) 31.0-31.9, adult: Secondary | ICD-10-CM

## 2020-02-23 MED ORDER — VITAMIN D (ERGOCALCIFEROL) 1.25 MG (50000 UNIT) PO CAPS: 50000.0000 [IU] | ORAL_CAPSULE | ORAL | 0 refills | Status: DC

## 2020-02-23 NOTE — Progress Notes (Signed)
Chief Complaint:   OBESITY Stanley Rice is here to discuss his progress with his obesity treatment plan along with follow-up of his obesity related diagnoses. Stanley Rice is on the Category 3 Plan and states he is following his eating plan approximately 80% of the time. Stanley Rice states he is doing cardio 15 minutes 4 times per week and weights 45 minutes 4 times per week.  Today's visit was #: 3 Starting weight: 199 lbs Starting date: 01/26/2020 Today's weight: 200 lbs Today's date: 02/23/2020 Total lbs lost to date: 0 Total lbs lost since last in-office visit: 1  Interim History: Stanley Rice states that he is beginning to become hungrier for his meals and is able to eat all of the food on the plan. He is asking about different options for snacks. He has begun lifting more weights.  Subjective:   Vitamin D deficiency. Stanley Rice is on prescription Vitamin D weekly. No nausea, vomiting, or muscle weakness.    Ref. Range 01/26/2020 10:52  Vitamin D, 25-Hydroxy Latest Ref Range: 30.0 - 100.0 ng/mL 27.2 (L)   Prediabetes. Stanley Rice has a diagnosis of prediabetes based on his elevated HgA1c and was informed this puts him at greater risk of developing diabetes. He continues to work on diet and exercise to decrease his risk of diabetes. He denies nausea or hypoglycemia. Stanley Rice is on no medication. No polyphagia. He is exercising regularly.  No results found for: HGBA1C Lab Results  Component Value Date   INSULIN 9.6 01/26/2020   At risk for diabetes mellitus. Stanley Rice is at higher than average risk for developing diabetes due to obesity.   Assessment/Plan:   Vitamin D deficiency. Low Vitamin D level contributes to fatigue and are associated with obesity, breast, and colon cancer. He was given a refill on his Vitamin D, Ergocalciferol, (DRISDOL) 1.25 MG (50000 UNIT) CAPS capsule every week #4 with 0 refills and will follow-up for routine testing of Vitamin D, at least 2-3 times per year to avoid over-replacement.    Prediabetes. Stanley Rice will continue to work on weight loss, exercise, and decreasing simple carbohydrates to help decrease the risk of diabetes.   At risk for diabetes mellitus. Stanley Rice was given approximately 15 minutes of diabetes education and counseling today. We discussed intensive lifestyle modifications today with an emphasis on weight loss as well as increasing exercise and decreasing simple carbohydrates in his diet. We also reviewed medication options with an emphasis on risk versus benefit of those discussed.   Repetitive spaced learning was employed today to elicit superior memory formation and behavioral change.  Class 1 obesity with serious comorbidity and body mass index (BMI) of 31.0 to 31.9 in adult, unspecified obesity type.  Stanley Rice is currently in the action stage of change. As such, his goal is to continue with weight loss efforts. He has agreed to the Category 3 Plan.   Exercise goals: For substantial health benefits, adults should do at least 150 minutes (2 hours and 30 minutes) a week of moderate-intensity, or 75 minutes (1 hour and 15 minutes) a week of vigorous-intensity aerobic physical activity, or an equivalent combination of moderate- and vigorous-intensity aerobic activity. Aerobic activity should be performed in episodes of at least 10 minutes, and preferably, it should be spread throughout the week.  Behavioral modification strategies: meal planning and cooking strategies and planning for success.  Stanley Rice has agreed to follow-up with our clinic in 2-3 weeks. He was informed of the importance of frequent follow-up visits to maximize his success with  intensive lifestyle modifications for his multiple health conditions.   Objective:   Blood pressure 131/79, pulse 65, temperature 98.6 F (37 C), height 5\' 7"  (1.702 m), weight 200 lb (90.7 kg), SpO2 97 %. Body mass index is 31.32 kg/m.  General: Cooperative, alert, well developed, in no acute distress. HEENT: Conjunctivae  and lids unremarkable. Cardiovascular: Regular rhythm.  Lungs: Normal work of breathing. Neurologic: No focal deficits.   Lab Results  Component Value Date   CREATININE 0.87 01/26/2020   BUN 8 01/26/2020   NA 140 01/26/2020   K 4.8 01/26/2020   CL 100 01/26/2020   CO2 27 01/26/2020   Lab Results  Component Value Date   ALT 37 01/26/2020   AST 27 01/26/2020   ALKPHOS 83 01/26/2020   BILITOT 0.4 01/26/2020   No results found for: HGBA1C Lab Results  Component Value Date   INSULIN 9.6 01/26/2020   Lab Results  Component Value Date   TSH 1.310 01/26/2020   No results found for: CHOL, HDL, LDLCALC, LDLDIRECT, TRIG, CHOLHDL Lab Results  Component Value Date   WBC 6.7 01/26/2020   HGB 14.2 01/26/2020   HCT 44.0 01/26/2020   MCV 88 01/26/2020   PLT 262 01/26/2020   No results found for: IRON, TIBC, FERRITIN  Attestation Statements:   Reviewed by clinician on day of visit: allergies, medications, problem list, medical history, surgical history, family history, social history, and previous encounter notes.  I10/20/2021, am acting as transcriptionist for Marianna Payment, PA-C   I have reviewed the above documentation for accuracy and completeness, and I agree with the above. Alois Cliche, PA-C

## 2020-03-16 ENCOUNTER — Ambulatory Visit (INDEPENDENT_AMBULATORY_CARE_PROVIDER_SITE_OTHER): Payer: Managed Care, Other (non HMO) | Admitting: Physician Assistant

## 2020-05-17 ENCOUNTER — Encounter (INDEPENDENT_AMBULATORY_CARE_PROVIDER_SITE_OTHER): Payer: Self-pay

## 2020-05-18 ENCOUNTER — Ambulatory Visit (INDEPENDENT_AMBULATORY_CARE_PROVIDER_SITE_OTHER): Payer: Managed Care, Other (non HMO) | Admitting: Physician Assistant

## 2020-06-01 ENCOUNTER — Ambulatory Visit (INDEPENDENT_AMBULATORY_CARE_PROVIDER_SITE_OTHER): Payer: BC Managed Care – PPO | Admitting: Physician Assistant

## 2020-06-01 ENCOUNTER — Other Ambulatory Visit: Payer: Self-pay

## 2020-06-01 ENCOUNTER — Encounter (INDEPENDENT_AMBULATORY_CARE_PROVIDER_SITE_OTHER): Payer: Self-pay | Admitting: Physician Assistant

## 2020-06-01 VITALS — BP 128/79 | HR 76 | Temp 98.7°F | Ht 67.0 in | Wt 199.0 lb

## 2020-06-01 DIAGNOSIS — Z9189 Other specified personal risk factors, not elsewhere classified: Secondary | ICD-10-CM

## 2020-06-01 DIAGNOSIS — E559 Vitamin D deficiency, unspecified: Secondary | ICD-10-CM | POA: Diagnosis not present

## 2020-06-01 DIAGNOSIS — E6609 Other obesity due to excess calories: Secondary | ICD-10-CM

## 2020-06-01 DIAGNOSIS — I10 Essential (primary) hypertension: Secondary | ICD-10-CM

## 2020-06-01 DIAGNOSIS — Z6832 Body mass index (BMI) 32.0-32.9, adult: Secondary | ICD-10-CM

## 2020-06-01 MED ORDER — VITAMIN D (ERGOCALCIFEROL) 1.25 MG (50000 UNIT) PO CAPS: 50000.0000 [IU] | ORAL_CAPSULE | ORAL | 0 refills | Status: DC

## 2020-06-03 NOTE — Progress Notes (Signed)
Chief Complaint:   OBESITY Kaeden is here to discuss his progress with his obesity treatment plan along with follow-up of his obesity related diagnoses. Basim is on the Category 3 Plan and states he is following his eating plan approximately 80% of the time. Kirin states he is doing cardio and weights for 60 minutes 3 times per week.  Today's visit was #: 4 Starting weight: 199 lbs Starting date: 01/26/2020 Today's weight: 199 lbs Today's date: 06/01/2020 Total lbs lost to date: 0 Total lbs lost since last in-office visit: 1  Interim History: Nnaemeka is eating each meal on the plan, but not always eating his snack calories. He uses protein powder sometimes, but he is not counting it as snack calories. He is bored with dinner.  Subjective:   1. Essential hypertension Aking is not on medications, and his blood pressure is controlled. He denies chest pain or headache.  2. Vitamin D deficiency Caprice ran out of his Vit D 2 months ago.  3. At risk for heart disease Yates is at a higher than average risk for cardiovascular disease due to obesity.   Assessment/Plan:   1. Essential hypertension Coye will continue working on healthy weight loss and exercise to improve blood pressure control. We will watch for signs of hypotension as he continues his lifestyle modifications.  2. Vitamin D deficiency Low Vitamin D level contributes to fatigue and are associated with obesity, breast, and colon cancer. We will refill prescription Vitamin D for 1 month. Dewel will follow-up for routine testing of Vitamin D, at least 2-3 times per year to avoid over-replacement.  - Vitamin D, Ergocalciferol, (DRISDOL) 1.25 MG (50000 UNIT) CAPS capsule; Take 1 capsule (50,000 Units total) by mouth every 7 (seven) days.  Dispense: 4 capsule; Refill: 0  3. At risk for heart disease Amirr was given approximately 15 minutes of coronary artery disease prevention counseling today. He is 30 y.o. male and has risk factors for  heart disease including obesity. We discussed intensive lifestyle modifications today with an emphasis on specific weight loss instructions and strategies.   Repetitive spaced learning was employed today to elicit superior memory formation and behavioral change.  4. Class 1 obesity due to excess calories with serious comorbidity and body mass index (BMI) of 32.0 to 32.9 in adult Dmonte is currently in the action stage of change. As such, his goal is to continue with weight loss efforts. He has agreed to the Category 3 Plan and keeping a food journal and adhering to recommended goals of 550-650 calories and 45 grams of protein at supper daily.   Exercise goals: As is.  Behavioral modification strategies: no skipping meals and meal planning and cooking strategies.  Kree has agreed to follow-up with our clinic in 3 weeks. He was informed of the importance of frequent follow-up visits to maximize his success with intensive lifestyle modifications for his multiple health conditions.   Objective:   Blood pressure 128/79, pulse 76, temperature 98.7 F (37.1 C), height 5\' 7"  (1.702 m), weight 199 lb (90.3 kg), SpO2 100 %. Body mass index is 31.17 kg/m.  General: Cooperative, alert, well developed, in no acute distress. HEENT: Conjunctivae and lids unremarkable. Cardiovascular: Regular rhythm.  Lungs: Normal work of breathing. Neurologic: No focal deficits.   Lab Results  Component Value Date   CREATININE 0.87 01/26/2020   BUN 8 01/26/2020   NA 140 01/26/2020   K 4.8 01/26/2020   CL 100 01/26/2020   CO2 27  01/26/2020   Lab Results  Component Value Date   ALT 37 01/26/2020   AST 27 01/26/2020   ALKPHOS 83 01/26/2020   BILITOT 0.4 01/26/2020   No results found for: HGBA1C Lab Results  Component Value Date   INSULIN 9.6 01/26/2020   Lab Results  Component Value Date   TSH 1.310 01/26/2020   No results found for: CHOL, HDL, LDLCALC, LDLDIRECT, TRIG, CHOLHDL Lab Results   Component Value Date   WBC 6.7 01/26/2020   HGB 14.2 01/26/2020   HCT 44.0 01/26/2020   MCV 88 01/26/2020   PLT 262 01/26/2020   No results found for: IRON, TIBC, FERRITIN  Attestation Statements:   Reviewed by clinician on day of visit: allergies, medications, problem list, medical history, surgical history, family history, social history, and previous encounter notes.   Trude Mcburney, am acting as transcriptionist for [employer], PA-C.  I have reviewed the above documentation for accuracy and completeness, and I agree with the above. Alois Cliche, PA-C

## 2020-06-22 ENCOUNTER — Ambulatory Visit (INDEPENDENT_AMBULATORY_CARE_PROVIDER_SITE_OTHER): Payer: BC Managed Care – PPO | Admitting: Physician Assistant

## 2020-07-08 ENCOUNTER — Encounter (INDEPENDENT_AMBULATORY_CARE_PROVIDER_SITE_OTHER): Payer: Self-pay

## 2020-07-13 ENCOUNTER — Ambulatory Visit (INDEPENDENT_AMBULATORY_CARE_PROVIDER_SITE_OTHER): Payer: BC Managed Care – PPO | Admitting: Physician Assistant

## 2021-06-13 ENCOUNTER — Other Ambulatory Visit: Payer: Self-pay

## 2021-06-13 ENCOUNTER — Ambulatory Visit
Admission: RE | Admit: 2021-06-13 | Discharge: 2021-06-13 | Disposition: A | Discharge status: Home / Self Care | Payer: BC Managed Care – PPO | Source: Ambulatory Visit | Attending: Student | Admitting: Student

## 2021-06-13 VITALS — BP 152/93 | HR 110 | Temp 102.7°F | Resp 18 | Ht 67.0 in | Wt 220.0 lb

## 2021-06-13 DIAGNOSIS — Z1152 Encounter for screening for COVID-19: Secondary | ICD-10-CM | POA: Diagnosis not present

## 2021-06-13 DIAGNOSIS — R509 Fever, unspecified: Secondary | ICD-10-CM | POA: Insufficient documentation

## 2021-06-13 LAB — RESP PANEL BY RT-PCR (FLU A&B, COVID) ARPGX2
Influenza A by PCR: NEGATIVE
Influenza B by PCR: NEGATIVE
SARS Coronavirus 2 by RT PCR: NEGATIVE

## 2021-06-13 MED ORDER — ACETAMINOPHEN 325 MG PO TABS
650.0000 mg | ORAL_TABLET | Freq: Once | ORAL | Status: AC
  Administered 2021-06-13: 650 mg via ORAL

## 2021-06-13 NOTE — Discharge Instructions (Addendum)
-  Your covid and influenza tests were both negative. You must have a different virus.  ?-With a virus, you're typically contagious for 5-7 days, or as long as you're having fevers.  ?-Continue over-the-counter medications for relief.  ?-You can take Tylenol up to 1000 mg 3 times daily, and ibuprofen up to 600 mg 3 times daily with food.  You can take these together, or alternate every 3-4 hours. ? ?

## 2021-06-13 NOTE — ED Triage Notes (Signed)
Patient is here for "Saturday morning started with Right shoulder pain, then last night sweats, today, started cold chills, pain in shoulder moved to neck  and sometimes mid lower back". No injury known. Fatigue "noticed today too". No URI symptoms.  ?

## 2021-06-13 NOTE — ED Provider Notes (Signed)
?[facility] ? ? ? ?CSN: [id number] ?Arrival date & time: 06/13/21  1544 ? ? ?  ? ?History   ?Chief Complaint ?Chief Complaint  ?Patient presents with  ? Pain   ?  Various areas. Appt: 4 pm.   ? Chills  ? ? ?HPI ?Stanley Rice is a 31 y.o. male presenting with myalgias.  History noncontributory.  Describes subjective fevers and chills, myalgias, fatigue.  Denies cough, congestion.  Minimal sore throat.  Has attempted some over-the-counter pain cream with temporary relief.  Denies shortness of breath, chest pain.  States both of his kids are sick. ?  ? ?HPI ? ?Past Medical History:  ?Diagnosis Date  ? High blood sugar   ? High cholesterol   ? Hypertension   ? Lactose intolerance   ? Prediabetes   ? ? ?Patient Active Problem List  ? Diagnosis Date Noted  ? Encounter for medical examination to establish care 01/01/2020  ? Class 1 obesity due to excess calories with serious comorbidity and body mass index (BMI) of 32.0 to 32.9 in adult 01/01/2020  ? Mixed hyperlipidemia 01/01/2020  ? Pre-diabetes 01/01/2020  ? Essential hypertension 01/01/2020  ? ? ?History reviewed. No pertinent surgical history. ? ? ? ? ?Home Medications   ? ?Prior to Admission medications   ?Medication Sig Start Date End Date Taking? Authorizing Provider  ?fexofenadine (ALLEGRA) 180 MG tablet Take 180 mg by mouth daily.    [provider]  ?Vitamin D, Ergocalciferol, (DRISDOL) 1.25 MG (50000 UNIT) CAPS capsule Take 1 capsule (50,000 Units total) by mouth every 7 (seven) days. 06/01/20   Alois Cliche, PA-C  ? ? ?Family History ?Family History  ?Problem Relation Age of Onset  ? Miscarriages / [place] Mother   ? Diabetes Mother   ? Alcohol abuse Father   ? Hypertension Father   ? Anxiety disorder Father   ? Alcoholism Father   ? Obesity Father   ? ? ?Social History ?Social History  ? ?Tobacco Use  ? Smoking status: Never  ? Smokeless tobacco: Never  ?Vaping Use  ? Vaping Use: Never used  ?Substance Use Topics  ? Alcohol use: No  ?  Drug use: No  ? ? ? ?Allergies   ?Patient has no known allergies. ? ? ?Review of Systems ?Review of Systems  ?Musculoskeletal:  Positive for myalgias. Negative for arthralgias.  ?All other systems reviewed and are negative. ? ? ?Physical Exam ?Triage Vital Signs ?ED Triage Vitals  ?Enc Vitals Group  ?   BP 06/13/21 1606 (!) 152/93  ?   Pulse Rate 06/13/21 1606 (!) 110  ?   Resp 06/13/21 1606 18  ?   Temp 06/13/21 1606 (!) 102.7 ?F (39.3 ?C)  ?   Temp Source 06/13/21 1606 Oral  ?   SpO2 06/13/21 1606 100 %  ?   Weight 06/13/21 1604 220 lb (99.8 kg)  ?   Height 06/13/21 1604 5\' 7"  (1.702 m)  ?   Head Circumference --   ?   Peak Flow --   ?   Pain Score 06/13/21 1603 6  ?   Pain Loc --   ?   Pain Edu? --   ?   Excl. in [place]? --   ? ?No data found. ? ?Updated Vital Signs ?BP (!) 152/93 (BP Location: Left Arm)   Pulse (!) 110   Temp (!) 102.7 ?F (39.3 ?C) (Oral)   Resp 18   Ht 5\' 7"  (1.702 m)  Wt 220 lb (99.8 kg)   SpO2 100%   BMI 34.46 kg/m?  ? ?Visual Acuity ?Right Eye Distance:   ?Left Eye Distance:   ?Bilateral Distance:   ? ?Right Eye Near:   ?Left Eye Near:    ?Bilateral Near:    ? ?Physical Exam ?Vitals reviewed.  ?Constitutional:   ?   General: He is not in acute distress. ?   Appearance: Normal appearance. He is not ill-appearing.  ?HENT:  ?   Head: Normocephalic and atraumatic.  ?   Right Ear: Tympanic membrane, ear canal and external ear normal. No tenderness. No middle ear effusion. There is no impacted cerumen. Tympanic membrane is not perforated, erythematous, retracted or bulging.  ?   Left Ear: Tympanic membrane, ear canal and external ear normal. No tenderness.  No middle ear effusion. There is no impacted cerumen. Tympanic membrane is not perforated, erythematous, retracted or bulging.  ?   Nose: Nose normal. No congestion.  ?   Mouth/Throat:  ?   Mouth: Mucous membranes are moist.  ?   Pharynx: Uvula midline. No oropharyngeal exudate or posterior oropharyngeal erythema.  ?   Comments: Minimal  erythema posterior pharynx. Tonsils are small. Uvula midline.  ?Eyes:  ?   Extraocular Movements: Extraocular movements intact.  ?   Pupils: Pupils are equal, round, and reactive to light.  ?Cardiovascular:  ?   Rate and Rhythm: Regular rhythm. Tachycardia present.  ?   Heart sounds: Normal heart sounds.  ?Pulmonary:  ?   Effort: Pulmonary effort is normal.  ?   Breath sounds: Normal breath sounds. No decreased breath sounds, wheezing, rhonchi or rales.  ?Abdominal:  ?   Palpations: Abdomen is soft.  ?   Tenderness: There is no abdominal tenderness. There is no guarding or rebound.  ?Lymphadenopathy:  ?   Cervical: No cervical adenopathy.  ?   Right cervical: No superficial cervical adenopathy. ?   Left cervical: No superficial cervical adenopathy.  ?Neurological:  ?   General: No focal deficit present.  ?   Mental Status: He is alert and oriented to person, place, and time.  ?Psychiatric:     ?   Mood and Affect: Mood normal.     ?   Behavior: Behavior normal.     ?   Thought Content: Thought content normal.     ?   Judgment: Judgment normal.  ? ? ? ?UC Treatments / Results  ?Labs ?(all labs ordered are listed, but only abnormal results are displayed) ?Labs Reviewed  ?RESP PANEL BY RT-PCR (FLU A&B, COVID) [id number]  ? ? ?EKG ? ? ?Radiology ?No results found. ? ?Procedures ?Procedures (including critical care time) ? ?Medications Ordered in UC ?Medications  ?acetaminophen (TYLENOL) tablet 650 mg (650 mg Oral Given 06/13/21 1610)  ? ? ?Initial Impression / Assessment and Plan / UC Course  ?I have reviewed the triage vital signs and the nursing notes. ? ?Pertinent labs & imaging results that were available during my care of the patient were reviewed by me and considered in my medical decision making (see chart for details). ? ?Clinical Course as of 06/13/21 1704  ?Mon Jun 13, 2021  ?1634 Resp Panel by RT-PCR (Flu A&B, Covid) Nasopharyngeal Swab [LG]  ?  ?Clinical Course User Index ?[LG] Rhys Martini, PA-C  ? ? ?This  patient is a very pleasant 31 y.o. year old male presenting with febrile illness. Febrile at 102.7, tachy at 110; acetaminophen administered during visit. ? ?Influenza and  covid PCR negative.  ? ?Work note provided. ED return precautions discussed. Patient verbalizes understanding and agreement.  ? ?Coding Level 4 for acute illness with systemic symptoms, and prescription drug management ?  ? ?Final Clinical Impressions(s) / UC Diagnoses  ? ?Final diagnoses:  ?Febrile illness  ?Encounter for screening for COVID-19  ? ? ? ?Discharge Instructions   ? ?  ?-Your covid and influenza tests were both negative. You must have a different virus.  ?-With a virus, you're typically contagious for 5-7 days, or as long as you're having fevers.  ?-Continue over-the-counter medications for relief.  ?-You can take Tylenol up to 1000 mg 3 times daily, and ibuprofen up to 600 mg 3 times daily with food.  You can take these together, or alternate every 3-4 hours. ? ? ? ? ? ?ED Prescriptions   ?None ?  ? ?PDMP not reviewed this encounter. ?  ?Rhys Martini, PA-C ?06/13/21 1704 ? ?

## 2021-11-16 ENCOUNTER — Encounter (INDEPENDENT_AMBULATORY_CARE_PROVIDER_SITE_OTHER): Payer: Self-pay

## 2022-05-17 ENCOUNTER — Ambulatory Visit
Admission: RE | Admit: 2022-05-17 | Discharge: 2022-05-17 | Disposition: A | Discharge status: Home / Self Care | Payer: BC Managed Care – PPO | Source: Ambulatory Visit | Attending: Urgent Care | Admitting: Urgent Care

## 2022-05-17 ENCOUNTER — Other Ambulatory Visit: Payer: Self-pay

## 2022-05-17 VITALS — BP 122/84 | HR 77 | Temp 98.5°F | Resp 20

## 2022-05-17 DIAGNOSIS — R112 Nausea with vomiting, unspecified: Secondary | ICD-10-CM | POA: Diagnosis not present

## 2022-05-17 DIAGNOSIS — E782 Mixed hyperlipidemia: Secondary | ICD-10-CM | POA: Diagnosis not present

## 2022-05-17 DIAGNOSIS — R7303 Prediabetes: Secondary | ICD-10-CM | POA: Diagnosis not present

## 2022-05-17 DIAGNOSIS — I10 Essential (primary) hypertension: Secondary | ICD-10-CM | POA: Insufficient documentation

## 2022-05-17 DIAGNOSIS — R197 Diarrhea, unspecified: Secondary | ICD-10-CM | POA: Insufficient documentation

## 2022-05-17 DIAGNOSIS — R6889 Other general symptoms and signs: Secondary | ICD-10-CM | POA: Diagnosis not present

## 2022-05-17 DIAGNOSIS — Z1152 Encounter for screening for COVID-19: Secondary | ICD-10-CM | POA: Insufficient documentation

## 2022-05-17 DIAGNOSIS — R10819 Abdominal tenderness, unspecified site: Secondary | ICD-10-CM | POA: Insufficient documentation

## 2022-05-17 MED ORDER — OSELTAMIVIR PHOSPHATE 75 MG PO CAPS: 75.0000 mg | ORAL_CAPSULE | Freq: Two times a day (BID) | ORAL | 0 refills | Status: DC

## 2022-05-17 MED ORDER — ONDANSETRON 4 MG PO TBDP: 4.0000 mg | ORAL_TABLET | Freq: Three times a day (TID) | ORAL | 0 refills | Status: DC | PRN

## 2022-05-17 MED ORDER — ONDANSETRON 4 MG PO TBDP
4.0000 mg | ORAL_TABLET | Freq: Once | ORAL | Status: AC
  Administered 2022-05-17: 4 mg via ORAL

## 2022-05-17 NOTE — ED Triage Notes (Signed)
Fever yesterday, vomiting and diarrhea, chills.  No episodes of vomiting today, 2 episodes yesterday.  2 episodes of diarrhea today, 7-8 episodes yesterday.    Has taken tylenol.  Last dose last night.

## 2022-05-17 NOTE — ED Provider Notes (Signed)
[name]    CSN: [id number] Arrival date & time: 05/17/22  1008      History   Chief Complaint Chief Complaint  Patient presents with   Diarrhea   Appointment    10:15    HPI Stanley Rice is a 32 y.o. male.    Diarrhea   Patient presents to urgent care for symptoms starting yesterday.  Reports fever, vomiting, diarrhea, chills.  He states he has no episodes of vomiting today but feeling nauseous and difficulty drinking fluids.  Reports 2 episodes of diarrhea today and multiple episodes yesterday.  Past Medical History:  Diagnosis Date   High blood sugar    High cholesterol    Hypertension    Lactose intolerance    Prediabetes     Patient Active Problem List   Diagnosis Date Noted   Encounter for medical examination to establish care 01/01/2020   Class 1 obesity due to excess calories with serious comorbidity and body mass index (BMI) of 32.0 to 32.9 in adult 01/01/2020   Mixed hyperlipidemia 01/01/2020   Pre-diabetes 01/01/2020   Essential hypertension 01/01/2020    History reviewed. No pertinent surgical history.     Home Medications    Prior to Admission medications   Medication Sig Start Date End Date Taking? Authorizing Provider  oseltamivir (TAMIFLU) 75 MG capsule Take 1 capsule (75 mg total) by mouth every 12 (twelve) hours. 05/17/22  Yes Lutie Pickler, Annie Main, FNP  fexofenadine (ALLEGRA) 180 MG tablet Take 180 mg by mouth daily. Patient not taking: Reported on 05/17/2022    [provider]  Vitamin D, Ergocalciferol, (DRISDOL) 1.25 MG (50000 UNIT) CAPS capsule Take 1 capsule (50,000 Units total) by mouth every 7 (seven) days. 06/01/20   Abby Potash, PA-C    Family History Family History  Problem Relation Age of Onset   73 / Stillbirths Mother    Diabetes Mother    Alcohol abuse Father    Hypertension Father    Anxiety disorder Father    Alcoholism Father    Obesity Father     Social History Social History    Tobacco Use   Smoking status: Never   Smokeless tobacco: Never  Vaping Use   Vaping Use: Never used  Substance Use Topics   Alcohol use: No   Drug use: No     Allergies   Patient has no known allergies.   Review of Systems Review of Systems  Gastrointestinal:  Positive for diarrhea.     Physical Exam Triage Vital Signs ED Triage Vitals  Enc Vitals Group     BP 05/17/22 1040 122/84     Pulse Rate 05/17/22 1040 77     Resp 05/17/22 1040 20     Temp 05/17/22 1040 98.5 F (36.9 C)     Temp Source 05/17/22 1040 Oral     SpO2 05/17/22 1040 95 %     Weight --      Height --      Head Circumference --      Peak Flow --      Pain Score 05/17/22 1037 5     Pain Loc --      Pain Edu? --      Excl. in [place]? --    No data found.  Updated Vital Signs BP 122/84 (BP Location: Right Arm) Comment (BP Location): large cuff  Pulse 77   Temp 98.5 F (36.9 C) (Oral)   Resp 20   SpO2 95%  Visual Acuity Right Eye Distance:   Left Eye Distance:   Bilateral Distance:    Right Eye Near:   Left Eye Near:    Bilateral Near:     Physical Exam Vitals reviewed.  Constitutional:      Appearance: Normal appearance.  Cardiovascular:     Pulses: Normal pulses.     Heart sounds: Normal heart sounds.  Pulmonary:     Effort: Pulmonary effort is normal.     Breath sounds: Normal breath sounds.  Abdominal:     General: Abdomen is flat. Bowel sounds are normal.     Palpations: Abdomen is soft.     Tenderness: There is generalized abdominal tenderness. There is no guarding.  Skin:    General: Skin is warm and dry.  Neurological:     General: No focal deficit present.     Mental Status: He is alert and oriented to person, place, and time.  Psychiatric:        Mood and Affect: Mood normal.        Behavior: Behavior normal.      UC Treatments / Results  Labs (all labs ordered are listed, but only abnormal results are displayed) Labs Reviewed  SARS CORONAVIRUS 2 (TAT  6-24 HRS)    EKG   Radiology No results found.  Procedures Procedures (including critical care time)  Medications Ordered in UC Medications  ondansetron (ZOFRAN-ODT) disintegrating tablet 4 mg (has no administration in time range)    Initial Impression / Assessment and Plan / UC Course  I have reviewed the triage vital signs and the nursing notes.  Pertinent labs & imaging results that were available during my care of the patient were reviewed by me and considered in my medical decision making (see chart for details).   Patient is afebrile here without recent antipyretics. Satting well on room air. Overall is well appearing, well hydrated, without respiratory distress. Pulmonary exam is unremarkable.  Lungs CTAB without wheezing, rhonchi, rales.  Diffuse abdominal tenderness without guarding.  Normal active bowel sounds.  Soft abdomen.  Symptoms are consistent with an acute viral process including influenza.  Will treat presumptively for influenza whilst awaiting results of COVID.  Otherwise recommending use of OTC medication for symptom control.  Ondansetron ODT 4 mg given in clinic today.  Discharged on same.  Final Clinical Impressions(s) / UC Diagnoses   Final diagnoses:  Nausea and vomiting, unspecified vomiting type  Flu-like symptoms     Discharge Instructions      You have been diagnosed with a viral upper respiratory infection based on your symptoms and exam. Viral illnesses cannot be treated with antibiotics - they are self limiting - and you should find your symptoms resolving within a few days. Get plenty of rest and non-caffeinated fluids. Watch for signs of dehydration including reduced urine output and dark colored urine.  We have performed a respiratory swab testing for COVID. I have prescribed Tamiflu, antiviral therapy for influenza A, based on a presumptive diagnosis of influenza.  Someone will contact you after results of your swab are available with  instructions to continue or stop this medication.If the results of this testing are positive, someone will call you if you are eligible for any antiviral treatment.    We recommend you use over-the-counter medications for symptom control including acetaminophen (Tylenol), ibuprofen (Advil/Motrin) or naproxen (Aleve) for throat pain, fever, chills or body aches. You may combine use of acetaminophen and ibuprofen/naproxen if needed.  Some patients find an  pain-relieving throat spray such as Chloraseptic to be effective.  Also recommend cold/cough medication containing a cough suppressant such as dextromethorphan, as needed. Please note that some cough medications are not recommended if you suffer from hypertension.    Saline mist spray is helpful for removing excess mucus from your nose.  Room humidifiers are helpful to ease breathing at night. I recommend guaifenesin (Mucinex) with plenty of water throughout the day to help thin and loosen mucus secretions in your respiratory passages.   If appropriate based upon your other medical problems, you might also find relief of nasal/sinus congestion symptoms by using a nasal decongestant such as fluticasone (Flonase ) or pseudoephedrine (Sudafed sinus).  You will need to obtain Sudafed from behind the pharmacist counter.  Speak to the pharmacist to verify that you are not duplicating medications with other over-the-counter formulations that you may be using.     ED Prescriptions     Medication Sig Dispense Auth. Provider   oseltamivir (TAMIFLU) 75 MG capsule Take 1 capsule (75 mg total) by mouth every 12 (twelve) hours. 10 capsule Cinch Ormond, Annie Main, FNP      PDMP not reviewed this encounter.   Rose Phi, [place] 05/17/22 1133

## 2022-05-17 NOTE — Discharge Instructions (Signed)
You have been diagnosed with a viral upper respiratory infection based on your symptoms and exam. Viral illnesses cannot be treated with antibiotics - they are self limiting - and you should find your symptoms resolving within a few days. Get plenty of rest and non-caffeinated fluids. Watch for signs of dehydration including reduced urine output and dark colored urine.  We have performed a respiratory swab testing for COVID. I have prescribed Tamiflu, antiviral therapy for influenza A, based on a presumptive diagnosis of influenza.  Someone will contact you after results of your swab are available with instructions to continue or stop this medication. If the results of this testing are positive, someone will call you if you are eligible for any antiviral treatment.    We recommend you use over-the-counter medications for symptom control including acetaminophen (Tylenol), ibuprofen (Advil/Motrin) or naproxen (Aleve) for throat pain, fever, chills or body aches. You may combine use of acetaminophen and ibuprofen/naproxen if needed.  Some patients find an pain-relieving throat spray such as Chloraseptic to be effective.  Also recommend cold/cough medication containing a cough suppressant such as dextromethorphan, as needed. Please note that some cough medications are not recommended if you suffer from hypertension.    Saline mist spray is helpful for removing excess mucus from your nose.  Room humidifiers are helpful to ease breathing at night. I recommend guaifenesin (Mucinex) with plenty of water throughout the day to help thin and loosen mucus secretions in your respiratory passages.   If appropriate based upon your other medical problems, you might also find relief of nasal/sinus congestion symptoms by using a nasal decongestant such as fluticasone (Flonase ) or pseudoephedrine (Sudafed sinus).  You will need to obtain Sudafed from behind the pharmacist counter.  Speak to the pharmacist to verify that you  are not duplicating medications with other over-the-counter formulations that you may be using.     

## 2022-05-18 LAB — SARS CORONAVIRUS 2 (TAT 6-24 HRS): SARS Coronavirus 2: NEGATIVE

## 2022-06-30 ENCOUNTER — Ambulatory Visit
Admission: RE | Admit: 2022-06-30 | Discharge: 2022-06-30 | Disposition: A | Discharge status: Home / Self Care | Payer: BC Managed Care – PPO | Source: Ambulatory Visit | Attending: Emergency Medicine | Admitting: Emergency Medicine

## 2022-06-30 ENCOUNTER — Ambulatory Visit: Payer: Self-pay

## 2022-06-30 VITALS — BP 120/90 | HR 82 | Temp 98.2°F | Resp 16 | Ht 67.0 in | Wt 225.0 lb

## 2022-06-30 DIAGNOSIS — J069 Acute upper respiratory infection, unspecified: Secondary | ICD-10-CM

## 2022-06-30 LAB — GROUP A STREP BY PCR: Group A Strep by PCR: NOT DETECTED

## 2022-06-30 MED ORDER — IPRATROPIUM BROMIDE 0.06 % NA SOLN: 2.0000 | Freq: Four times a day (QID) | NASAL | 12 refills | Status: DC

## 2022-06-30 NOTE — ED Triage Notes (Signed)
Pt c/o fever,chills,bodyaches & sore throat x1 day. Otc tylenol w/minor relief last dose around 5 AM.

## 2022-06-30 NOTE — Discharge Instructions (Signed)
Your strep test was negative today and your exam is consistent with a viral URI.  Use OTC Tylenol and/or Ibuprofen as needed for fever and body aches.  Use the Atrovent nasal spray, 2 squirts in each nostril every 6 hours as needed, for nasal congestion.  Rest and drink plenty of fluids to help stay hydrated and help your body fight the viral infection.  Return for any new or worsening symptoms.

## 2022-06-30 NOTE — ED Provider Notes (Signed)
[facility]    CSN: [id number] Arrival date & time: 06/30/22  [id number]      History   Chief Complaint Chief Complaint  Patient presents with   Fever    Appt   Chills   Generalized Body Aches   Sore Throat    HPI Stanley Rice is a 32 y.o. male.   HPI  32 year old male with a past medical history significant for high blood sugar, high cholesterol, hypertension, and prediabetes presenting for evaluation of 1 day worth of URI symptoms.  He reports that he had influenza 1 month ago and his son had influenza last week.  Last night he developed a subjective fever with chills, body aches, and sore throat.  He has been taking Tylenol with minor relief of symptoms.  She also has had some runny nose, nasal congestion but he denies cough or GI symptoms.  Past Medical History:  Diagnosis Date   High blood sugar    High cholesterol    Hypertension    Lactose intolerance    Prediabetes     Patient Active Problem List   Diagnosis Date Noted   Encounter for medical examination to establish care 01/01/2020   Class 1 obesity due to excess calories with serious comorbidity and body mass index (BMI) of 32.0 to 32.9 in adult 01/01/2020   Mixed hyperlipidemia 01/01/2020   Pre-diabetes 01/01/2020   Essential hypertension 01/01/2020    History reviewed. No pertinent surgical history.     Home Medications    Prior to Admission medications   Medication Sig Start Date End Date Taking? Authorizing Provider  ipratropium (ATROVENT) 0.06 % nasal spray Place 2 sprays into both nostrils 4 (four) times daily. 06/30/22  Yes Margarette Canada, NP  fexofenadine (ALLEGRA) 180 MG tablet Take 180 mg by mouth daily. Patient not taking: Reported on 05/17/2022    [provider]  ondansetron (ZOFRAN-ODT) 4 MG disintegrating tablet Take 1 tablet (4 mg total) by mouth every 8 (eight) hours as needed for nausea or vomiting. 05/17/22   Immordino, Annie Main, FNP  Vitamin D, Ergocalciferol, (DRISDOL) 1.25  MG (50000 UNIT) CAPS capsule Take 1 capsule (50,000 Units total) by mouth every 7 (seven) days. 06/01/20   Abby Potash, PA-C    Family History Family History  Problem Relation Age of Onset   64 / Stillbirths Mother    Diabetes Mother    Alcohol abuse Father    Hypertension Father    Anxiety disorder Father    Alcoholism Father    Obesity Father     Social History Social History   Tobacco Use   Smoking status: Never   Smokeless tobacco: Never  Vaping Use   Vaping Use: Never used  Substance Use Topics   Alcohol use: No   Drug use: No     Allergies   Patient has no known allergies.   Review of Systems Review of Systems  Constitutional:  Positive for fever.  HENT:  Positive for congestion, rhinorrhea and sore throat. Negative for ear pain.   Respiratory:  Negative for cough.   Hematological:  Negative for adenopathy.     Physical Exam Triage Vital Signs ED Triage Vitals  Enc Vitals Group     BP 06/30/22 1001 (!) 120/90     Pulse Rate 06/30/22 1001 82     Resp 06/30/22 1001 16     Temp 06/30/22 1001 98.2 F (36.8 C)     Temp Source 06/30/22 1001 Oral  SpO2 06/30/22 1001 96 %     Weight 06/30/22 1000 225 lb (102.1 kg)     Height 06/30/22 1000 5\' 7"  (1.702 m)     Head Circumference --      Peak Flow --      Pain Score 06/30/22 1000 0     Pain Loc --      Pain Edu? --      Excl. in [place]? --    No data found.  Updated Vital Signs BP (!) 120/90 (BP Location: Left Arm)   Pulse 82   Temp 98.2 F (36.8 C) (Oral)   Resp 16   Ht 5\' 7"  (1.702 m)   Wt 225 lb (102.1 kg)   SpO2 96%   BMI 35.24 kg/m   Visual Acuity Right Eye Distance:   Left Eye Distance:   Bilateral Distance:    Right Eye Near:   Left Eye Near:    Bilateral Near:     Physical Exam Vitals and nursing note reviewed.  Constitutional:      Appearance: Normal appearance. He is not ill-appearing or diaphoretic.  HENT:     Head: Normocephalic and atraumatic.     Right  Ear: Tympanic membrane, ear canal and external ear normal. There is no impacted cerumen.     Left Ear: Tympanic membrane, ear canal and external ear normal. There is no impacted cerumen.     Nose: Congestion and rhinorrhea present.     Comments: Patient has crusty yellow discharge in both nares.    Mouth/Throat:     Mouth: Mucous membranes are moist.     Pharynx: Oropharynx is clear. Posterior oropharyngeal erythema present. No oropharyngeal exudate.     Comments: Tonsillar pillars are unremarkable.  Posterior oropharynx does demonstrate erythema and injection with some lymphoid follicles. Cardiovascular:     Rate and Rhythm: Normal rate.     Pulses: Normal pulses.     Heart sounds: Normal heart sounds. No murmur heard.    No friction rub. No gallop.  Pulmonary:     Effort: Pulmonary effort is normal.     Breath sounds: Normal breath sounds. No wheezing, rhonchi or rales.  Musculoskeletal:     Cervical back: Normal range of motion and neck supple.  Lymphadenopathy:     Cervical: No cervical adenopathy.  Skin:    General: Skin is warm and dry.     Capillary Refill: Capillary refill takes less than 2 seconds.     Findings: No rash.  Neurological:     General: No focal deficit present.     Mental Status: He is alert and oriented to person, place, and time.      UC Treatments / Results  Labs (all labs ordered are listed, but only abnormal results are displayed) Labs Reviewed  GROUP A STREP BY PCR    EKG   Radiology No results found.  Procedures Procedures (including critical care time)  Medications Ordered in UC Medications - No data to display  Initial Impression / Assessment and Plan / UC Course  I have reviewed the triage vital signs and the nursing notes.  Pertinent labs & imaging results that were available during my care of the patient were reviewed by me and considered in my medical decision making (see chart for details).   Patient is a pleasant,  nontoxic-appearing 32 year old male presenting for evaluation of upper respiratory symptoms as outlined in HPI above.  His most significant symptom is a sore throat.  A strep  PCR was collected at triage and is negative.  He does have mild erythema and injection to the posterior oropharynx, as well as several lymphoid follicles.  I suspect the patient has a viral upper respiratory infection I will treat him as such.  He should continue Tylenol and/or ibuprofen as needed for body aches and fever.  I will prescribe Atrovent nasal spray that he can use for the nasal congestion.  Seeing as how he had influenza 1 month ago I do not suspect that he has influenza at this time.   Final Clinical Impressions(s) / UC Diagnoses   Final diagnoses:  Viral upper respiratory tract infection     Discharge Instructions      Your strep test was negative today and your exam is consistent with a viral URI.  Use OTC Tylenol and/or Ibuprofen as needed for fever and body aches.  Use the Atrovent nasal spray, 2 squirts in each nostril every 6 hours as needed, for nasal congestion.  Rest and drink plenty of fluids to help stay hydrated and help your body fight the viral infection.  Return for any new or worsening symptoms.      ED Prescriptions     Medication Sig Dispense Auth. Provider   ipratropium (ATROVENT) 0.06 % nasal spray Place 2 sprays into both nostrils 4 (four) times daily. 15 mL Margarette Canada, NP      PDMP not reviewed this encounter.   Margarette Canada, NP 06/30/22 1047

## 2023-08-15 ENCOUNTER — Ambulatory Visit
Admission: RE | Admit: 2023-08-15 | Discharge: 2023-08-15 | Disposition: A | Discharge status: Home / Self Care | Payer: Self-pay | Source: Ambulatory Visit | Attending: Emergency Medicine | Admitting: Emergency Medicine

## 2023-08-15 VITALS — BP 127/84 | HR 99 | Temp 99.6°F | Resp 16 | Ht 68.0 in | Wt 225.0 lb

## 2023-08-15 DIAGNOSIS — B349 Viral infection, unspecified: Secondary | ICD-10-CM | POA: Insufficient documentation

## 2023-08-15 LAB — RESP PANEL BY RT-PCR (FLU A&B, COVID) ARPGX2
Influenza A by PCR: NEGATIVE
Influenza B by PCR: NEGATIVE
SARS Coronavirus 2 by RT PCR: NEGATIVE

## 2023-08-15 LAB — GROUP A STREP BY PCR: Group A Strep by PCR: NOT DETECTED

## 2023-08-15 MED ORDER — BENZONATATE 100 MG PO CAPS: 200.0000 mg | ORAL_CAPSULE | Freq: Three times a day (TID) | ORAL | 0 refills | Status: DC

## 2023-08-15 MED ORDER — ONDANSETRON 8 MG PO TBDP: 8.0000 mg | ORAL_TABLET | Freq: Three times a day (TID) | ORAL | 0 refills | Status: DC | PRN

## 2023-08-15 MED ORDER — PROMETHAZINE-DM 6.25-15 MG/5ML PO SYRP: 5.0000 mL | ORAL_SOLUTION | Freq: Four times a day (QID) | ORAL | 0 refills | Status: DC | PRN

## 2023-08-15 NOTE — ED Provider Notes (Signed)
 [facility]    CSN: [id number] Arrival date & time: 08/15/23  1603      History   Chief Complaint Chief Complaint  Patient presents with   Chills   Emesis   Discharge Note   Diarrhea    HPI Abed Croxford is a 33 y.o. male.   HPI  33 year old male with past medical history significant for prediabetes, hypertension, and high cholesterol presents for evaluation of subjective fever, chills, sore throat, nonproductive cough, headache, and bodyaches that started 2 days ago.  His wife is suffering from similar symptoms but he denies any other known sick contacts.  He denies any runny nose, nasal congestion, ear pain, shortness breath, or wheezing.  Past Medical History:  Diagnosis Date   High blood sugar    High cholesterol    Hypertension    Lactose intolerance    Prediabetes     Patient Active Problem List   Diagnosis Date Noted   Encounter for medical examination to establish care 01/01/2020   Class 1 obesity due to excess calories with serious comorbidity and body mass index (BMI) of 32.0 to 32.9 in adult 01/01/2020   Mixed hyperlipidemia 01/01/2020   Pre-diabetes 01/01/2020   Essential hypertension 01/01/2020    History reviewed. No pertinent surgical history.     Home Medications    Prior to Admission medications   Medication Sig Start Date End Date Taking? Authorizing Provider  benzonatate (TESSALON) 100 MG capsule Take 2 capsules (200 mg total) by mouth every 8 (eight) hours. 08/15/23  Yes [name] Pear, NP  ondansetron  (ZOFRAN -ODT) 8 MG disintegrating tablet Take 1 tablet (8 mg total) by mouth every 8 (eight) hours as needed for nausea or vomiting. 08/15/23  Yes [name] Pear, NP  promethazine-dextromethorphan (PROMETHAZINE-DM) 6.25-15 MG/5ML syrup Take 5 mLs by mouth 4 (four) times daily as needed. 08/15/23  Yes [name] Pear, NP    Family History Family History  Problem Relation Age of Onset   Miscarriages / [place] Mother    Diabetes Mother     Alcohol abuse Father    Hypertension Father    Anxiety disorder Father    Alcoholism Father    Obesity Father     Social History Social History   Tobacco Use   Smoking status: Never   Smokeless tobacco: Never  Vaping Use   Vaping status: Never Used  Substance Use Topics   Alcohol use: No   Drug use: No     Allergies   Patient has no known allergies.   Review of Systems Review of Systems  Constitutional:  Positive for chills and fever.  HENT:  Positive for sore throat. Negative for congestion, ear pain and rhinorrhea.   Respiratory:  Positive for cough. Negative for shortness of breath and wheezing.   Gastrointestinal:  Positive for diarrhea, nausea and vomiting.  Musculoskeletal:  Positive for arthralgias and myalgias.  Skin:  Negative for rash.  Neurological:  Positive for headaches.     Physical Exam Triage Vital Signs ED Triage Vitals  Encounter Vitals Group     BP      Systolic BP Percentile      Diastolic BP Percentile      Pulse      Resp      Temp      Temp src      SpO2      Weight      Height      Head Circumference      Peak Flow  Pain Score      Pain Loc      Pain Education      Exclude from Growth Chart    No data found.  Updated Vital Signs BP 127/84 (BP Location: Left Arm)   Pulse 99   Temp 99.6 F (37.6 C) (Oral)   Resp 16   Ht 5\' 8"  (1.727 m)   Wt 225 lb (102.1 kg)   SpO2 95%   BMI 34.21 kg/m   Visual Acuity Right Eye Distance:   Left Eye Distance:   Bilateral Distance:    Right Eye Near:   Left Eye Near:    Bilateral Near:     Physical Exam Vitals and nursing note reviewed.  Constitutional:      Appearance: Normal appearance. He is not ill-appearing.  HENT:     Head: Normocephalic and atraumatic.     Right Ear: Tympanic membrane, ear canal and external ear normal. There is no impacted cerumen.     Left Ear: Tympanic membrane, ear canal and external ear normal. There is no impacted cerumen.     Nose: Nose  normal.     Mouth/Throat:     Mouth: Mucous membranes are moist.     Pharynx: Oropharynx is clear. Posterior oropharyngeal erythema present. No oropharyngeal exudate.     Comments: Posterior pharynx demonstrates erythema and lymphoid follicles.  Tonsillar pillars are unremarkable. Cardiovascular:     Rate and Rhythm: Normal rate and regular rhythm.     Pulses: Normal pulses.     Heart sounds: Normal heart sounds. No murmur heard.    No friction rub. No gallop.  Pulmonary:     Effort: Pulmonary effort is normal.     Breath sounds: Normal breath sounds. No wheezing, rhonchi or rales.  Musculoskeletal:     Cervical back: Normal range of motion and neck supple. No tenderness.  Lymphadenopathy:     Cervical: No cervical adenopathy.  Skin:    General: Skin is warm and dry.     Capillary Refill: Capillary refill takes less than 2 seconds.     Findings: No rash.  Neurological:     General: No focal deficit present.     Mental Status: He is alert and oriented to person, place, and time.      UC Treatments / Results  Labs (all labs ordered are listed, but only abnormal results are displayed) Labs Reviewed  GROUP A STREP BY PCR  RESP PANEL BY RT-PCR (FLU A&B, COVID) [id number]    EKG   Radiology No results found.  Procedures Procedures (including critical care time)  Medications Ordered in UC Medications - No data to display  Initial Impression / Assessment and Plan / UC Course  I have reviewed the triage vital signs and the nursing notes.  Pertinent labs & imaging results that were available during my care of the patient were reviewed by me and considered in my medical decision making (see chart for details).   Patient is a nontoxic-appearing 33 year old male presenting for evaluation of flulike symptoms that started 2 days ago as outlined in the HPI above.  In the exam room he is not in any acute distress.  Other than a sore throat he denies any associated URI symptoms.  He  did develop a slight, nonproductive cough last night but no shortness of breath or wheezing.  He has had nausea, vomiting, diarrhea, headache, and bodyaches.  He also reports a subjective fever stating that he felt hot but he did  not measure his temperature today.  His physical exam does reveal erythema to the back of his throat with lymphoid follicles but tonsillar pillars are unremarkable.  Nasal mucosa also does not demonstrate any erythema, edema, or discharge.  No cervical lymphadenopathy present.  Cardiopulmonary exam reveals clear lung sounds in all fields.  Differential diagnose include COVID, influenza, strep pharyngitis, viral illness.  I will order a COVID and flu PCR as well as a strep PCR.  Strep PCR is negative.  Respiratory panel is negative for COVID or influenza.  I will discharge patient home with a diagnosis of viral illness and have him use over-the-counter Tylenol  and/or ibuprofen as needed for any fever or pain.  I will prescribe Tessalon Perles and Pepcid and cough syrup for cough congestion along with Zofran  8 mg that he can take every 8 hours as needed for nausea or vomiting.  Return precautions reviewed.  Work note provided.   Final Clinical Impressions(s) / UC Diagnoses   Final diagnoses:  Viral illness     Discharge Instructions      Your testing today is negative for COVID, influenza, and strep.  I do believe you have a viral illness that is causing your symptoms.  Use over-the-counter Tylenol  and/or ibuprofen according to package directions as needed for any fever or pain.  To soothe your throat you may gargle with warm salt water as often as you would like.  Mix 1 tablespoon of table salt in 8 ounces of warm water, gargle and spit.  You may also use over-the-counter Chloraseptic or Sucrets lozenges needed for sore throat relief.  No more than 1 lozenge every 2 hours as the menthol may give you diarrhea.  Use the Tessalon Perles every 8 hours during the day.   Take them with a small sip of water.  They may give you some numbness to the base of your tongue or a metallic taste in your mouth, this is normal.  Use the Promethazine DM cough syrup at bedtime for cough and congestion.  It will make you drowsy so do not take it during the day.  Return for reevaluation or see your primary care provider for any new or worsening symptoms.      ED Prescriptions     Medication Sig Dispense Auth. Provider   ondansetron  (ZOFRAN -ODT) 8 MG disintegrating tablet Take 1 tablet (8 mg total) by mouth every 8 (eight) hours as needed for nausea or vomiting. 20 tablet [name] Pear, NP   benzonatate (TESSALON) 100 MG capsule Take 2 capsules (200 mg total) by mouth every 8 (eight) hours. 21 capsule [name] Pear, NP   promethazine-dextromethorphan (PROMETHAZINE-DM) 6.25-15 MG/5ML syrup Take 5 mLs by mouth 4 (four) times daily as needed. 118 mL [name] Pear, NP      PDMP not reviewed this encounter.   [name] Pear, NP 08/15/23 [phone]

## 2023-08-15 NOTE — ED Triage Notes (Signed)
 Pt c/o sore throat,chills & N/V/D x2 days.

## 2023-08-15 NOTE — Discharge Instructions (Signed)
 Your testing today is negative for COVID, influenza, and strep.  I do believe you have a viral illness that is causing your symptoms.  Use over-the-counter Tylenol  and/or ibuprofen according to package directions as needed for any fever or pain.  To soothe your throat you may gargle with warm salt water as often as you would like.  Mix 1 tablespoon of table salt in 8 ounces of warm water, gargle and spit.  You may also use over-the-counter Chloraseptic or Sucrets lozenges needed for sore throat relief.  No more than 1 lozenge every 2 hours as the menthol may give you diarrhea.  Use the Tessalon Perles every 8 hours during the day.  Take them with a small sip of water.  They may give you some numbness to the base of your tongue or a metallic taste in your mouth, this is normal.  Use the Promethazine DM cough syrup at bedtime for cough and congestion.  It will make you drowsy so do not take it during the day.  Return for reevaluation or see your primary care provider for any new or worsening symptoms.

## 2023-12-23 NOTE — Progress Notes (Addendum)
 Subjective Patient ID: Stanley Rice is a 33 y.o. male.    Stanley Rice is a 33 y.o. male who presents to clinic for evaluation of sinus pressure over the past 2 days.  Associate symptoms of fever, nasal congestion, cough, diarrhea.  Patient states that he does not have any significant medical history.  Denies any kidney or liver disease.   History provided by:  Patient Language interpreter used: No     Review of Systems  Constitutional:  Positive for fever. Negative for appetite change, chills and fatigue.  HENT:  Positive for congestion, sinus pressure and sore throat. Negative for ear pain, postnasal drip and rhinorrhea.   Eyes:  Negative for discharge and redness.  Respiratory:  Positive for cough. Negative for chest tightness, shortness of breath and wheezing.   Cardiovascular:  Negative for chest pain.  Gastrointestinal:  Positive for diarrhea. Negative for abdominal pain, nausea and vomiting.  Musculoskeletal:  Negative for myalgias, neck pain and neck stiffness.  Skin:  Negative for rash.  Neurological:  Negative for headaches.  Psychiatric/Behavioral:  Negative for sleep disturbance.     Patient History  Allergies: No Known Allergies  History reviewed. No pertinent past medical history. History reviewed. No pertinent surgical history. Social History   Socioeconomic History  . Marital status: Married    Spouse name: Not on file  . Number of children: Not on file  . Years of education: Not on file  . Highest education level: Not on file  Occupational History  . Not on file  Tobacco Use  . Smoking status: Never  . Smokeless tobacco: Never  Substance and Sexual Activity  . Alcohol use: Never  . Drug use: Not on file  . Sexual activity: Yes  Other Topics Concern  . Not on file  Social History Narrative  . Not on file   History reviewed. No pertinent family history. No current outpatient medications on file prior to visit.   No current facility-administered  medications on file prior to visit.    Objective  Vitals:   12/23/23 0901  BP: 122/85  BP Location: Left arm  Pulse: 88  Resp: 18  Temp: 37.2 C (99 F)  TempSrc: Oral  SpO2: 98%  Weight: 103 kg  Height: 5' 7  PainSc:   2               No results found.  Physical Exam Vitals and nursing note reviewed.  Constitutional:      General: He is not in acute distress.    Appearance: Normal appearance. He is normal weight. He is not ill-appearing, toxic-appearing or diaphoretic.  HENT:     Head: Normocephalic and atraumatic.     Right Ear: Tympanic membrane, ear canal and external ear normal.     Left Ear: Tympanic membrane, ear canal and external ear normal.     Nose: Congestion present. No rhinorrhea.     Right Turbinates: Enlarged.     Left Turbinates: Enlarged.     Right Sinus: No maxillary sinus tenderness or frontal sinus tenderness.     Left Sinus: No maxillary sinus tenderness or frontal sinus tenderness.     Mouth/Throat:     Lips: Pink.     Mouth: Mucous membranes are moist.     Pharynx: Oropharynx is clear. No oropharyngeal exudate or posterior oropharyngeal erythema.  Eyes:     General:        Right eye: No discharge.        Left  eye: No discharge.     Conjunctiva/sclera: Conjunctivae normal.  Cardiovascular:     Rate and Rhythm: Normal rate and regular rhythm.     Heart sounds: Normal heart sounds.  Pulmonary:     Effort: Pulmonary effort is normal. No respiratory distress.     Breath sounds: Normal breath sounds.  Musculoskeletal:     Cervical back: Full passive range of motion without pain, normal range of motion and neck supple. No rigidity.  Lymphadenopathy:     Cervical: No cervical adenopathy.     Right cervical: No superficial cervical adenopathy.    Left cervical: No superficial cervical adenopathy.  Skin:    General: Skin is warm and dry.     Coloration: Skin is not jaundiced or pale.     Findings: No rash.  Neurological:     General: No  focal deficit present.     Mental Status: He is alert.  Psychiatric:        Mood and Affect: Mood normal.        Behavior: Behavior normal.     Results for orders placed or performed in visit on 12/23/23  POCT rapid strep A manually resulted  Component Result   Rapid Strep A Screen Negative   Internal Quality Control Pass  POCT QuickVue Antigen Test  Component Result   Rapid Covid Positive (A)   Internal Quality Control Pass  POCT RAPID INFLUENZA MCKESSON  Component Result   Rapid Influenza A Ag Negative   Rapid Influenza B Ag Negative   Internal Quality Control Pass       Procedures MDM:     1 Acute, uncomplicated illness or injury     Explanation of Medical Decision Making and variances from expected care:  Increased BMI. Paxlovid prescribed for management of COVID-19 infection. COVID-19 precautions, including isolation guidelines, symptom monitoring, and when to seek further care, were reviewed with the patient. No contraindications to Paxlovid noted at this time.   Unique ordered tests: Three+     Review of any test results: Three+     Assessment requiring historian other than patient: No     Independent visualization of image, tracing, or test: No     Discussion of management with another provider: No     Risk:: Moderate            Assessment/Plan Diagnoses and all orders for this visit:  COVID-19 -     Nirmatrelvir&Ritonavir 300/100 (Paxlovid, 300/100,) 20 x 150 MG & 10 x 100MG  tablet therapy pack; Take 3 Doses by mouth every 12 (twelve) hours for 5 days. Take by mouth Nirmatrelvir 300 mg with ritonavir 100 mg, administered together, twice daily for 5 days. -     benzonatate  (Tessalon ) 200 MG capsule; Take 1 capsule (200 mg total) by mouth 3 (three) times a day if needed for cough for up to 7 days. Do not crush or chew.  Other orders -     POCT rapid strep A manually resulted -     POCT QuickVue Antigen Test -     POCT RAPID INFLUENZA MCKESSON   Results  for orders placed or performed in visit on 12/23/23  POCT rapid strep A manually resulted   Collection Time: 12/23/23  9:14 AM  Result Value Ref Range   Rapid Strep A Screen Negative Negative   Internal Quality Control Pass Pass  POCT QuickVue Antigen Test   Collection Time: 12/23/23  9:14 AM  Result Value Ref Range   Rapid  Covid Positive (A) Negative, Indeterminate   Internal Quality Control Pass Pass  POCT RAPID INFLUENZA MCKESSON   Collection Time: 12/23/23  9:16 AM  Result Value Ref Range   Rapid Influenza A Ag Negative Negative, Indeterminate   Rapid Influenza B Ag Negative Negative, Indeterminate   Internal Quality Control Pass Pass     Disposition Status: Home  Patient Instructions  Discharge Instructions: COVID-19 (Coronavirus Disease 2019)  ? Diagnosis: COVID-19 You have tested positive for COVID-19, a respiratory virus caused by the SARS-CoV-2 virus. This illness can range from mild symptoms to more serious complications, especially in older adults or people with chronic health conditions.  ?? Home Isolation Guidance (Stay-at-Home Period) You must stay home and avoid contact with others until:  . You are fever-free for at least 24 hours without using fever-reducing medications, AND  . Your symptoms are improving overall (e.g., less coughing, less congestion, more energy).  Note: Even if your symptoms are mild, you may still be contagious. This isolation period varies based on how quickly your symptoms improve.  While at home:  . Stay in a separate room from others as much as possible.  . Use a separate bathroom if available.  . Avoid sharing personal items, such as towels, utensils, or electronics.  . Improve airflow in your living space by opening windows or using air purifiers.  ?? After Isolation: 5 Days of Added Precaution Once you meet the criteria to leave isolation, you should still take steps to protect others for an additional 5 days, because some people  can still spread the virus during this time. During this 5-day period: . Wear a high-quality, well-fitting mask (such as an N95 or KN95) around others, especially indoors. . Avoid visiting high-risk individuals, such as elderly family members or people with chronic illnesses. . Continue to avoid crowded or poorly ventilated indoor spaces. SABRA Hauser your hands often and cover coughs/sneezes with a tissue or your elbow. . Maintain physical distance when possible, especially in public places. . If you must go out, try to limit the duration of contact with others and choose outdoor or well-ventilated settings.  ?? Symptom Management at Home . Fever, body aches: Acetaminophen  (Tylenol ) or ibuprofen (Advil, Motrin) as directed. Read warning labels. Take as directed per OTC labeling instructions.  . Cough: Use cough suppressants or soothing remedies like honey (if not contraindicated). . Congestion: Use saline sprays, steam inhalation, or over-the-counter decongestants. . Fatigue: Get extra rest, eat balanced meals, and stay hydrated with water, broth, or electrolyte fluids. . Limit return to exercise, work, or other physically demanding activities until you are fully recovered and your healthcare provider approves.  ?? Monitor Your Symptoms Most people recover at home without needing medical treatment. However, it's important to monitor your condition daily.  ?? Seek immediate medical attention if you experience: ?Trouble breathing or shortness of breath ?Persistent chest pain or pressure ?Bluish lips, face, or nails ?Sudden confusion or inability to stay awake ?Worsening symptoms after initial improvement  If you need emergency care, call 911 and inform them that you have COVID-19.  ?? Testing Guidance You do not need a negative COVID-19 test to end isolation. However, you may consider testing if: . Your employer or school requires it . You are visiting someone high-risk . Your symptoms  return or worsen after initial improvement . If there is still some clinical suspicion for COVID but the initial test resulted negative   Protecting Others at Home If you live with others,  wear a mask when in shared spaces. Clean high-touch surfaces (doorknobs, remotes, counters) daily with disinfectants. Do laundry and dishes using hot water and soap.  ?? Vaccination and Prevention After recovery, stay up-to-date with your COVID-19 vaccinations and boosters, which reduce the risk of reinfection and serious illness. Also consider vaccinations for other respiratory viruses, like influenza and RSV, especially during cold/flu season.  ?? Follow-Up Care Follow up with your primary care provider in 7-10 if no improvement in symptoms.    For relief of nasal congestion patient can trial Simply Saline spray over the counter as directed per labeling instructions, OTC Guaifenesin (e.g. Mucinex) as directed per labeling instructions. Please read warning labels.   For additional nasal congestion relief, patient can also trial Aureliano Med Sinus rinses with distilled water twice daily. To thin our mucous post nasal drainage symptoms, patient can also use warm non caffeinated tea and/or drink plenty of oral fluids.    Intranasal Steroids such as Flonase or Nasacort over the counter can be used as well. Typically with intra-nasal steroids must be treat for 3-6 weeks minimum if indicated. Sometimes intra-nasal steroids can cause mucous membranes of the nose to dry out too much so typically these can be used in conjunction with Simply Saline sprays or AYR saline gel.  Warm non-caffeine tea with lots of honey and lemon recommended.     Progress note signed by Alm Prophet, PA on 12/23/23 at  9:32 AM

## 2024-03-05 ENCOUNTER — Ambulatory Visit (INDEPENDENT_AMBULATORY_CARE_PROVIDER_SITE_OTHER): Payer: Self-pay | Admitting: Nurse Practitioner

## 2024-03-05 ENCOUNTER — Encounter: Payer: Self-pay | Admitting: Nurse Practitioner

## 2024-03-05 VITALS — BP 145/88 | HR 79 | Temp 98.2°F | Resp 16 | Ht 67.0 in | Wt 229.6 lb

## 2024-03-05 DIAGNOSIS — E538 Deficiency of other specified B group vitamins: Secondary | ICD-10-CM

## 2024-03-05 DIAGNOSIS — I10 Essential (primary) hypertension: Secondary | ICD-10-CM

## 2024-03-05 DIAGNOSIS — E782 Mixed hyperlipidemia: Secondary | ICD-10-CM | POA: Diagnosis not present

## 2024-03-05 DIAGNOSIS — M79671 Pain in right foot: Secondary | ICD-10-CM | POA: Diagnosis not present

## 2024-03-05 DIAGNOSIS — Z6835 Body mass index (BMI) 35.0-35.9, adult: Secondary | ICD-10-CM

## 2024-03-05 DIAGNOSIS — R7303 Prediabetes: Secondary | ICD-10-CM | POA: Diagnosis not present

## 2024-03-05 DIAGNOSIS — M79672 Pain in left foot: Secondary | ICD-10-CM

## 2024-03-05 DIAGNOSIS — E66812 Obesity, class 2: Secondary | ICD-10-CM

## 2024-03-05 DIAGNOSIS — E559 Vitamin D deficiency, unspecified: Secondary | ICD-10-CM

## 2024-03-05 DIAGNOSIS — L219 Seborrheic dermatitis, unspecified: Secondary | ICD-10-CM

## 2024-03-05 NOTE — Progress Notes (Signed)
 [facility] [address] [place], [place] [address]  Internal MEDICINE  Office Visit Note  Patient Name: Stanley Rice  [id number]  [id number]  Date of Service: 03/05/2024   Complaints/HPI Pt is here for establishment of PCP. Chief Complaint  Patient presents with   New Patient (Initial Visit)    Prediabetes, feet pain    HPI Stanley Rice presents for a new patient visit to establish care.  Well-appearing 33 y.o. male with hypertension, prediabetes, low vitamin D , obesity.  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  Home: live at home with wife and 3 sons.  Diet: fair, high caffeine intake per patient report.  Exercise: go to gym to workout 2-3 times a week  Tobacco use: none  Alcohol use: none  Illicit drug use: none  Labs: due for routine labs  New or worsening pain: none  Elevated BP today, not currently on any medications.  Recent covid infection in September, treated with paxlovid History of pre-diabetes  Bilateral foot pain -- dorsal foot pain, standing or running, on feet all day at work. Also has history of plantar fasciitis but this is not bothering him now.   Current Medication: Outpatient Encounter Medications as of 03/05/2024  Medication Sig   hydrocortisone 2.5 % lotion PLEASE SEE ATTACHED FOR DETAILED DIRECTIONS   ketoconazole (NIZORAL) 2 % shampoo PLEASE SEE ATTACHED FOR DETAILED DIRECTIONS   [DISCONTINUED] benzonatate  (TESSALON ) 100 MG capsule Take 2 capsules (200 mg total) by mouth every 8 (eight) hours. (Patient not taking: Reported on 03/05/2024)   [DISCONTINUED] ondansetron  (ZOFRAN -ODT) 8 MG disintegrating tablet Take 1 tablet (8 mg total) by mouth every 8 (eight) hours as needed for nausea or vomiting. (Patient not taking: Reported on 03/05/2024)   [DISCONTINUED] promethazine -dextromethorphan (PROMETHAZINE -DM) 6.25-15 MG/5ML syrup Take 5 mLs by mouth 4 (four) times daily as needed. (Patient not taking: Reported on 03/05/2024)   No  facility-administered encounter medications on file as of 03/05/2024.    Surgical History: History reviewed. No pertinent surgical history.  Medical History: Past Medical History:  Diagnosis Date   High blood sugar    High cholesterol    Hypertension    Lactose intolerance    Prediabetes     Family History: Family History  Problem Relation Age of Onset   Miscarriages / Stillbirths Mother    Diabetes Mother    Alcohol abuse Father    Hypertension Father    Anxiety disorder Father    Alcoholism Father    Obesity Father     Social History   Socioeconomic History   Marital status: Married    Spouse name: Not on file   Number of children: 2   Years of education: Not on file   Highest education level: Bachelor's degree (e.g., BA, AB, BS)  Occupational History   Occupation: lab assist    Employer: [employer]  Tobacco Use   Smoking status: Never   Smokeless tobacco: Never  Vaping Use   Vaping status: Never Used  Substance and Sexual Activity   Alcohol use: No   Drug use: No   Sexual activity: Yes  Other Topics Concern   Not on file  Social History Narrative   Married with 2 kids, age 46 and 9 yo      Going to school at [employer] for research scientist (medical). Work as teacher, english as a foreign language.    Social Drivers of Corporate Investment Banker Strain: Not on [employer] Insecurity: Not on file  Transportation Needs: Not on file  Physical Activity: Not on file  Stress: Not on file  Social Connections: Not on file  Intimate Partner Violence: Not on file     Review of Systems  Constitutional:  Negative for chills, fatigue and unexpected weight change.  HENT:  Negative for congestion, postnasal drip, rhinorrhea, sneezing and sore throat.   Eyes:  Negative for redness.  Respiratory: Negative.  Negative for cough, chest tightness, shortness of breath and wheezing.   Cardiovascular: Negative.  Negative for chest pain and palpitations.  Gastrointestinal:  Negative for abdominal pain, constipation,  diarrhea, nausea and vomiting.  Genitourinary:  Negative for dysuria and frequency.  Musculoskeletal:  Negative for arthralgias, back pain, joint swelling and neck pain.  Skin:  Negative for rash.  Neurological: Negative.  Negative for tremors and numbness.  Hematological:  Negative for adenopathy. Does not bruise/bleed easily.  Psychiatric/Behavioral:  Negative for behavioral problems (Depression), sleep disturbance and suicidal ideas. The patient is not nervous/anxious.     Vital Signs: BP (!) 145/88 Comment: 150/94  Pulse 79   Temp 98.2 F (36.8 C)   Resp 16   Ht 5' 7 (1.702 m)   Wt 229 lb 9.6 oz (104.1 kg)   SpO2 97%   BMI 35.96 kg/m    Physical Exam Vitals reviewed.  Constitutional:      General: He is not in acute distress.    Appearance: Normal appearance. He is obese. He is not ill-appearing.  HENT:     Head: Normocephalic and atraumatic.  Eyes:     Pupils: Pupils are equal, round, and reactive to light.  Cardiovascular:     Rate and Rhythm: Normal rate and regular rhythm.  Pulmonary:     Effort: Pulmonary effort is normal. No respiratory distress.  Skin:    Capillary Refill: Capillary refill takes less than 2 seconds.  Neurological:     Mental Status: He is alert and oriented to person, place, and time.  Psychiatric:        Mood and Affect: Mood normal.        Behavior: Behavior normal.       Assessment/Plan: 1. Essential hypertension (Primary) Routine labs ordered. BP improved some when rechecked. Will recheck at next visit and determine if BP medication is needed.  - CBC with Differential/Platelet - CMP14+EGFR - Lipid Profile - Vitamin D  (25 hydroxy) - Hgb A1C w/o eAG - B12 and Folate Panel  2. Pre-diabetes Routine labs ordered  - CBC with Differential/Platelet - CMP14+EGFR - Lipid Profile - Vitamin D  (25 hydroxy) - Hgb A1C w/o eAG - B12 and Folate Panel  3. Mixed hyperlipidemia Routine labs ordered  - CBC with Differential/Platelet -  CMP14+EGFR - Lipid Profile - Vitamin D  (25 hydroxy) - Hgb A1C w/o eAG - B12 and Folate Panel  4. Bilateral foot pain Referred to podiatry  - Ambulatory referral to Podiatry  5. B12 deficiency Routine labs ordered  - CBC with Differential/Platelet - CMP14+EGFR - Lipid Profile - Vitamin D  (25 hydroxy) - Hgb A1C w/o eAG - B12 and Folate Panel  6. Vitamin D  deficiency Routine labs ordered  - CBC with Differential/Platelet - CMP14+EGFR - Lipid Profile - Vitamin D  (25 hydroxy) - Hgb A1C w/o eAG - B12 and Folate Panel  7. Seborrheic dermatitis Continue topical treatments prescribed by dermatology   8. Class 2 severe obesity due to excess calories with serious comorbidity and body mass index (BMI) of 35.0 to 35.9 in adult Concerned about weight gain, needs to get labs drawn first  General Counseling: Hasaan verbalizes understanding of the findings of todays visit and agrees with plan of treatment. I have discussed any further diagnostic evaluation that may be needed or ordered today. We also reviewed his medications today. he has been encouraged to call the office with any questions or concerns that should arise related to todays visit.    Orders Placed This Encounter  Procedures   CBC with Differential/Platelet   CMP14+EGFR   Lipid Profile   Vitamin D  (25 hydroxy)   Hgb A1C w/o eAG   B12 and Folate Panel   Ambulatory referral to Podiatry    No orders of the defined types were placed in this encounter.   Return for CPE, Kenry Daubert PCP and lab results in 3-4 weeks in december, have labs done prior to visit. .  Time spent:30 Minutes Time spent with patient included reviewing progress notes, labs, imaging studies, and discussing plan for follow up.    Controlled Substance Database was reviewed by me for overdose risk score (ORS)   This patient was seen by Mardy Maxin, FNP-C in collaboration with Dr. Sigrid Bathe as a part of collaborative care  agreement.   Nashid Pellum R. Maxin, MSN, FNP-C Internal Medicine

## 2024-03-08 ENCOUNTER — Encounter: Payer: Self-pay | Admitting: Nurse Practitioner

## 2024-03-18 ENCOUNTER — Ambulatory Visit: Payer: Self-pay | Admitting: Nurse Practitioner

## 2024-03-18 LAB — CMP14+EGFR
ALT: 27 IU/L (ref 0–44)
AST: 17 IU/L (ref 0–40)
Albumin: 4.7 g/dL (ref 4.1–5.1)
Alkaline Phosphatase: 71 IU/L (ref 47–123)
BUN/Creatinine Ratio: 13 (ref 9–20)
BUN: 13 mg/dL (ref 6–20)
Bilirubin Total: 0.5 mg/dL (ref 0.0–1.2)
CO2: 25 mmol/L (ref 20–29)
Calcium: 9.5 mg/dL (ref 8.7–10.2)
Chloride: 100 mmol/L (ref 96–106)
Creatinine, Ser: 1.04 mg/dL (ref 0.76–1.27)
Globulin, Total: 2.6 g/dL (ref 1.5–4.5)
Glucose: 84 mg/dL (ref 70–99)
Potassium: 4.1 mmol/L (ref 3.5–5.2)
Sodium: 139 mmol/L (ref 134–144)
Total Protein: 7.3 g/dL (ref 6.0–8.5)
eGFR: 98 mL/min/1.73 (ref >59)

## 2024-03-18 LAB — B12 AND FOLATE PANEL
Folate: 6.8 ng/mL (ref >3.0)
Vitamin B-12: 900 pg/mL (ref 232–1245)

## 2024-03-18 LAB — CBC WITH DIFFERENTIAL/PLATELET
Basophils Absolute: 0.1 x10E3/uL (ref 0.0–0.2)
Basos: 1 %
EOS (ABSOLUTE): 0.2 x10E3/uL (ref 0.0–0.4)
Eos: 2 %
Hematocrit: 44.6 % (ref 37.5–51.0)
Hemoglobin: 14.3 g/dL (ref 13.0–17.7)
Immature Grans (Abs): 0 x10E3/uL (ref 0.0–0.1)
Immature Granulocytes: 0 %
Lymphocytes Absolute: 3.7 x10E3/uL — ABNORMAL HIGH (ref 0.7–3.1)
Lymphs: 33 %
MCH: 28.7 pg (ref 26.6–33.0)
MCHC: 32.1 g/dL (ref 31.5–35.7)
MCV: 89 fL (ref 79–97)
Monocytes Absolute: 0.7 x10E3/uL (ref 0.1–0.9)
Monocytes: 6 %
Neutrophils Absolute: 6.7 x10E3/uL (ref 1.4–7.0)
Neutrophils: 58 %
Platelets: 262 x10E3/uL (ref 150–450)
RBC: 4.99 x10E6/uL (ref 4.14–5.80)
RDW: 13.6 % (ref 11.6–15.4)
WBC: 11.4 x10E3/uL — ABNORMAL HIGH (ref 3.4–10.8)

## 2024-03-18 LAB — LIPID PANEL
Chol/HDL Ratio: 7.1 ratio — ABNORMAL HIGH (ref 0.0–5.0)
Cholesterol, Total: 299 mg/dL — ABNORMAL HIGH (ref 100–199)
HDL: 42 mg/dL (ref >39)
LDL Chol Calc (NIH): 211 mg/dL — ABNORMAL HIGH (ref 0–99)
Triglycerides: 233 mg/dL — ABNORMAL HIGH (ref 0–149)
VLDL Cholesterol Cal: 46 mg/dL — ABNORMAL HIGH (ref 5–40)

## 2024-03-18 LAB — VITAMIN D 25 HYDROXY (VIT D DEFICIENCY, FRACTURES): Vit D, 25-Hydroxy: 75.2 ng/mL (ref 30.0–100.0)

## 2024-03-18 LAB — HGB A1C W/O EAG: Hgb A1c MFr Bld: 5.8 % — ABNORMAL HIGH (ref 4.8–5.6)

## 2024-03-18 NOTE — Progress Notes (Signed)
 We will discuss the lab results at his upcoming office visit later this month

## 2024-03-31 ENCOUNTER — Ambulatory Visit

## 2024-03-31 ENCOUNTER — Ambulatory Visit: Admitting: Podiatry

## 2024-03-31 VITALS — Ht 67.0 in | Wt 229.6 lb

## 2024-03-31 DIAGNOSIS — M216X1 Other acquired deformities of right foot: Secondary | ICD-10-CM

## 2024-03-31 DIAGNOSIS — M62461 Contracture of muscle, right lower leg: Secondary | ICD-10-CM | POA: Diagnosis not present

## 2024-03-31 DIAGNOSIS — M216X2 Other acquired deformities of left foot: Secondary | ICD-10-CM

## 2024-03-31 DIAGNOSIS — M79671 Pain in right foot: Secondary | ICD-10-CM

## 2024-03-31 DIAGNOSIS — M62462 Contracture of muscle, left lower leg: Secondary | ICD-10-CM | POA: Diagnosis not present

## 2024-03-31 NOTE — Patient Instructions (Addendum)
 " VISIT SUMMARY: Today, you were seen for bilateral foot pain and tightness associated with your longstanding foot conditions, including bilateral equinus contracture, pes cavus, and metatarsus adductus. We discussed your symptoms, which are worsened by running and prolonged standing, and reviewed your current use of insoles.  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 We recommend a home physical therapy program with daily stretching exercises for your calf muscles, to be done twice daily. Using a night splint can help stretch your Achilles tendon overnight and reduce morning tightness. Custom-molded orthotics can provide additional support and relief. We provided you with the necessary codes for insurance coverage verification for these orthotics. Please follow up for orthotic scanning once your insurance coverage is confirmed. Written instructions for the stretching exercises and a summary of today's visit were provided at checkout. Contact us  if your symptoms worsen or do not improve.  INSTRUCTIONS: Please follow up for orthotic scanning once your insurance coverage is confirmed. Contact the office if your symptoms worsen or do not improve.       Do exercises exactly as told by your health care provider and adjust them as directed. It is normal to feel mild stretching, pulling, tightness, or discomfort as you do these exercises. Stop the exercise right away if you feel sudden pain or your pain gets worse.   Stretching exercises These exercises improve the movement and flexibility of your calf muscles. These exercises may also help to relieve pain and stiffness. Standing gastroc stretch  This exercise is also called a standing calf (gastroc) stretch. Stand with your hands against a wall. Extend your left / right leg behind  you, and bend your front knee slightly. Your heels should be on the floor. Keeping your heels on the floor and your back knee straight, shift your weight toward the wall. You should feel a gentle stretch in the back of your lower leg (calf). Hold this position for 10 seconds. Repeat 10 times. Complete this exercise 2 times a day. Gastroc and soleus stretch, standing This is an exercise in which you stand on a step and use your body weight to stretch your calf muscles. To do this exercise: Stand with the ball of your left / right foot on a step. The ball of your foot is on the walking surface, right under your toes. Keep your other foot firmly on the same step. Hold on to the wall, a railing, or a chair for balance. Slowly lift your other foot, allowing your body weight to press your left / right heel down over the edge of the step. You should feel a stretch in your left / right calf. Hold this position for 10 seconds. Return both feet to the step. Repeat this exercise with a slight bend in your left / right knee. Repeat 10 times. Complete this exercise 2 times a day. Strengthening exercise This exercise builds strength and endurance in your foot muscles and may help to take pressure off your heel. Endurance is the ability to use your muscles for a long time, even after they get tired. Arch lifts This exercise is sometimes called foot intrinsics. This is an exercise in which you lift the arch part of your foot only. To do this exercise: Sit in a chair with your feet flat on the floor. Keeping your big toe and your heel on the floor, lift  only your arch, which is on the inner edge of your left / right foot. Do not move your knee or scrunch your toes. This is a small movement. Hold this position for 10 seconds. Return to the starting position. Repeat 10 times. Complete this exercise 2 times a day.        Check with your insurance if they cover custom molded foot orthotics. The codes they  will want are: L3020 (procedure code for the orthotics) and M21.6X1 and M21.6X2     "

## 2024-04-01 ENCOUNTER — Encounter: Payer: Self-pay | Admitting: Nurse Practitioner

## 2024-04-01 ENCOUNTER — Ambulatory Visit: Admitting: Nurse Practitioner

## 2024-04-01 VITALS — BP 148/94 | HR 90 | Temp 98.0°F | Resp 16 | Ht 67.0 in | Wt 231.8 lb

## 2024-04-01 DIAGNOSIS — E78019 Familial hypercholesterolemia, unspecified: Secondary | ICD-10-CM | POA: Diagnosis not present

## 2024-04-01 DIAGNOSIS — Z6835 Body mass index (BMI) 35.0-35.9, adult: Secondary | ICD-10-CM

## 2024-04-01 DIAGNOSIS — D72829 Elevated white blood cell count, unspecified: Secondary | ICD-10-CM

## 2024-04-01 DIAGNOSIS — R7303 Prediabetes: Secondary | ICD-10-CM

## 2024-04-01 DIAGNOSIS — E66812 Obesity, class 2: Secondary | ICD-10-CM | POA: Diagnosis not present

## 2024-04-01 MED ORDER — ROSUVASTATIN CALCIUM 10 MG PO TABS: 10.0000 mg | ORAL_TABLET | Freq: Every day | ORAL | 1 refills | Status: AC

## 2024-04-01 NOTE — Progress Notes (Signed)
 "   Subjective:  Patient ID: Stanley Rice, male    DOB: Nov 11, 1990,  MRN: [id number]  Chief Complaint  Patient presents with   Foot Pain    Rm 9 Patient is here for bilateral foot pain. Pain is located on dorsal aspect of feet and radiates to lateral side and 5th toes. Pt states more wear and tear on the lateral side of shoes.    Discussed the use of AI scribe software for clinical note transcription with the patient, who gave verbal consent to proceed.  History of Present Illness Stanley Rice is a 33 year old male with bilateral equinus contracture, pes cavus, and metatarsus adductus who presents with bilateral foot pain and tightness.  He reports intermittent pain and tightness in both feet, primarily localized to the dorsal and lateral aspects. Symptoms are exacerbated by running and prolonged standing at work, particularly while wearing steel-toed shoes. He stands throughout the day and exercises two to three times per week, including running.  He previously ran 15-20 miles per week but reduced his mileage due to increased pain. Upon resuming running, he again experienced tightness in his feet. He uses OrthoFeet insoles in his work shoes, which provide partial relief. He has not used custom orthotics.  He also notes tightness in his calf muscles and occasional shin discomfort. He denies prior foot injuries, fractures, or surgeries.      Objective:    Physical Exam VASCULAR: DP and PT pulse palpable. Foot is warm and well-perfused. Capillary fill time is brisk. DERMATOLOGIC: Normal skin turgor, texture, and temperature. No open lesions, rashes, or ulcerations. NEUROLOGIC: Normal sensation to light touch and pressure. No paresthesias. ORTHOPEDIC: Smooth, pain-free range of motion of subtalar, midfoot, and MTP joints. No ecchymosis or bruising. No gross deformity. No pain to palpation of dorsal midfoot. Gastrocnemius equinus contracture.   No images are attached to the encounter.     Results Radiology Bilateral foot radiographs (03/31/2024): Pes cavus and metatarsus adductus deformities without degenerative changes, fracture, or stress fracture (Independently interpreted)   Assessment:   1. Deformity, foot acquired, cavovarus, left   2. Deformity, foot acquired, cavovarus, right   3. Gastrocnemius equinus of left lower extremity   4. Gastrocnemius equinus of right lower extremity      Plan:  Patient was evaluated and treated and all questions answered.  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 Deformities are longstanding, exacerbated by increased activity and gastrocnemius-soleus complex tightness. Radiographs show no arthritis, fracture, or degenerative changes. Surgical intervention to lower the arch is possible but not indicated at this time. Conservative management is appropriate to address symptoms and improve function. - Educated him on the relationship between foot structure, calf muscle tightness, and symptoms. - Recommended a home physical therapy program with daily gastrocnemius-soleus stretching exercises, to be performed twice daily. - Discussed use of a night splint to stretch the Achilles tendon overnight and reduce morning tightness. - Discussed custom-molded orthotics with modifications to offload the lateral foot and support the arch. - Provided CPT and ICD codes for custom orthotics for insurance coverage verification prior to scanning and fabrication. - Instructed him to follow up for orthotic scanning once insurance coverage is confirmed. - Provided written instructions for stretching exercises and a summary of the visit at checkout. - Advised him to contact the office if symptoms worsen or fail  to improve.      No follow-ups on  file.   "

## 2024-04-01 NOTE — Progress Notes (Signed)
 [facility] [address] [place], [place] [address]  Internal MEDICINE  Office Visit Note  Patient Name: Stanley Rice  [id number]  [id number]  Date of Service: 04/01/2024  Chief Complaint  Patient presents with   Hyperlipidemia   Hypertension   Follow-up    Labs review    HPI Stanley Rice presents for a follow-up visit for lab results.  Prediabetes Very elevated LDL, total cholesterol, VLDL and triglycerides Elevated WBC CMP is normal, vitamin D  is normal, B12 and folate are normal range.     Current Medication: Outpatient Encounter Medications as of 04/01/2024  Medication Sig   rosuvastatin  (CRESTOR ) 10 MG tablet Take 1 tablet (10 mg total) by mouth daily.   hydrocortisone 2.5 % lotion PLEASE SEE ATTACHED FOR DETAILED DIRECTIONS   ketoconazole (NIZORAL) 2 % shampoo PLEASE SEE ATTACHED FOR DETAILED DIRECTIONS   No facility-administered encounter medications on file as of 04/01/2024.    Surgical History: History reviewed. No pertinent surgical history.  Medical History: Past Medical History:  Diagnosis Date   High blood sugar    High cholesterol    Hypertension    Lactose intolerance    Prediabetes     Family History: Family History  Problem Relation Age of Onset   Miscarriages / Stillbirths Mother    Diabetes Mother    Alcohol abuse Father    Hypertension Father    Anxiety disorder Father    Alcoholism Father    Obesity Father     Social History   Socioeconomic History   Marital status: Married    Spouse name: Not on file   Number of children: 2   Years of education: Not on file   Highest education level: Bachelor's degree (e.g., BA, AB, BS)  Occupational History   Occupation: lab assist    Employer: [employer]  Tobacco Use   Smoking status: Never   Smokeless tobacco: Never  Vaping Use   Vaping status: Never Used  Substance and Sexual Activity   Alcohol use: No   Drug use: No   Sexual activity: Yes  Other Topics Concern   Not on file   Social History Narrative   Married with 2 kids, age 31 and 21 yo      Going to school at [employer] for research scientist (medical). Work as teacher, english as a foreign language.    Social Drivers of Health   Tobacco Use: Low Risk (04/01/2024)   Patient History    Smoking Tobacco Use: Never    Smokeless Tobacco Use: Never    Passive Exposure: Not on file  Financial Resource Strain: Not on file  Food Insecurity: Not on file  Transportation Needs: Not on file  Physical Activity: Not on file  Stress: Not on file  Social Connections: Not on file  Intimate Partner Violence: Not on file  Depression (EYV7-0): Not on file  Alcohol Screen: Not on file  Housing: Not on file  Utilities: Not on file  Health Literacy: Not on file      Review of Systems  Constitutional:  Negative for chills, fatigue and unexpected weight change.  HENT:  Negative for congestion, postnasal drip, rhinorrhea, sneezing and sore throat.   Eyes:  Negative for redness.  Respiratory: Negative.  Negative for cough, chest tightness, shortness of breath and wheezing.   Cardiovascular: Negative.  Negative for chest pain and palpitations.  Gastrointestinal:  Negative for abdominal pain, constipation, diarrhea, nausea and vomiting.  Genitourinary:  Negative for dysuria and frequency.  Musculoskeletal:  Negative for arthralgias, back pain,  joint swelling and neck pain.  Skin:  Negative for rash.  Neurological: Negative.  Negative for tremors and numbness.  Hematological:  Negative for adenopathy. Does not bruise/bleed easily.  Psychiatric/Behavioral:  Negative for behavioral problems (Depression), sleep disturbance and suicidal ideas. The patient is not nervous/anxious.     Vital Signs: BP (!) 148/94   Pulse 90   Temp 98 F (36.7 C)   Resp 16   Ht 5' 7 (1.702 m)   Wt 231 lb 12.8 oz (105.1 kg)   SpO2 98%   BMI 36.31 kg/m    Physical Exam Vitals reviewed.  Constitutional:      General: He is not in acute distress.    Appearance: Normal appearance.  He is obese. He is not ill-appearing.  HENT:     Head: Normocephalic and atraumatic.  Eyes:     Pupils: Pupils are equal, round, and reactive to light.  Cardiovascular:     Rate and Rhythm: Normal rate and regular rhythm.  Pulmonary:     Effort: Pulmonary effort is normal. No respiratory distress.  Skin:    Capillary Refill: Capillary refill takes less than 2 seconds.  Neurological:     Mental Status: He is alert and oriented to person, place, and time.  Psychiatric:        Mood and Affect: Mood normal.        Behavior: Behavior normal.        Assessment/Plan: 1. Familial hypercholesterolemia, unspecified type (Primary) Start rosuvastatin  as prescribed. Encourage low cholesterol low fat diet and increase physical activity as tolerated.  - rosuvastatin  (CRESTOR ) 10 MG tablet; Take 1 tablet (10 mg total) by mouth daily.  Dispense: 90 tablet; Refill: 1  2. Pre-diabetes Noted, encourage low carb low sugar diet and increase physical activity as tolerated.   3. Leukocytosis, unspecified type Per patient, he was getting over a bad cold when he had his labs done. This should improve on its own, no follow up needed.   4. Class 2 severe obesity due to excess calories with serious comorbidity and body mass index (BMI) of 35.0 to 35.9 in adult Adjusting diet and increasing physical activity as tolerated should help with weight loss as well.    General Counseling: Stanley Rice verbalizes understanding of the findings of todays visit and agrees with plan of treatment. I have discussed any further diagnostic evaluation that may be needed or ordered today. We also reviewed his medications today. he has been encouraged to call the office with any questions or concerns that should arise related to todays visit.    No orders of the defined types were placed in this encounter.   Meds ordered this encounter  Medications   rosuvastatin  (CRESTOR ) 10 MG tablet    Sig: Take 1 tablet (10 mg total) by  mouth daily.    Dispense:  90 tablet    Refill:  1    Fill new script today    Return in about 3 months (around 06/30/2024) for F/U, Stanley Rice PCP, Labs, eval new med.   Total time spent:30 Minutes Time spent includes review of chart, medications, test results, and follow up plan with the patient.   [place] Controlled Substance Database was reviewed by me.  This patient was seen by Mardy Maxin, FNP-C in collaboration with Dr. Sigrid Bathe as a part of collaborative care agreement.   Carlin Attridge R. Maxin, MSN, FNP-C Internal medicine

## 2024-04-11 ENCOUNTER — Encounter: Payer: Self-pay | Admitting: Nurse Practitioner

## 2024-06-30 ENCOUNTER — Ambulatory Visit: Admitting: Nurse Practitioner
